# Patient Record
Sex: Male | Born: 1995 | Race: Black or African American | Hispanic: No | Marital: Married | Smoking: Current every day smoker
Health system: Southern US, Community
[De-identification: ages and names within clinical notes are randomized; demographics above are authoritative.]

---
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
## 2014-11-20 ENCOUNTER — Emergency Department (HOSPITAL_COMMUNITY)
Admission: EM | Admit: 2014-11-20 | Discharge: 2014-11-20 | Disposition: A | Discharge status: Home / Self Care | Payer: BLUE CROSS/BLUE SHIELD | Attending: Emergency Medicine | Admitting: Emergency Medicine

## 2014-11-20 ENCOUNTER — Emergency Department (HOSPITAL_COMMUNITY): Payer: BLUE CROSS/BLUE SHIELD

## 2014-11-20 ENCOUNTER — Encounter (HOSPITAL_COMMUNITY): Payer: Self-pay | Admitting: Emergency Medicine

## 2014-11-20 DIAGNOSIS — S60512A Abrasion of left hand, initial encounter: Secondary | ICD-10-CM | POA: Insufficient documentation

## 2014-11-20 DIAGNOSIS — S60511A Abrasion of right hand, initial encounter: Secondary | ICD-10-CM | POA: Diagnosis not present

## 2014-11-20 DIAGNOSIS — Y998 Other external cause status: Secondary | ICD-10-CM | POA: Diagnosis not present

## 2014-11-20 DIAGNOSIS — S67194A Crushing injury of right ring finger, initial encounter: Secondary | ICD-10-CM | POA: Diagnosis not present

## 2014-11-20 DIAGNOSIS — X58XXXA Exposure to other specified factors, initial encounter: Secondary | ICD-10-CM | POA: Insufficient documentation

## 2014-11-20 DIAGNOSIS — Y92310 Basketball court as the place of occurrence of the external cause: Secondary | ICD-10-CM | POA: Insufficient documentation

## 2014-11-20 DIAGNOSIS — Z791 Long term (current) use of non-steroidal anti-inflammatories (NSAID): Secondary | ICD-10-CM | POA: Insufficient documentation

## 2014-11-20 DIAGNOSIS — S60519A Abrasion of unspecified hand, initial encounter: Secondary | ICD-10-CM

## 2014-11-20 DIAGNOSIS — Y9367 Activity, basketball: Secondary | ICD-10-CM | POA: Diagnosis not present

## 2014-11-20 DIAGNOSIS — S6991XA Unspecified injury of right wrist, hand and finger(s), initial encounter: Secondary | ICD-10-CM

## 2014-11-20 MED ORDER — IBUPROFEN 400 MG PO TABS
600.0000 mg | ORAL_TABLET | Freq: Once | ORAL | Status: AC
  Administered 2014-11-20: 600 mg via ORAL
  Filled 2014-11-20: qty 2

## 2014-11-20 MED ORDER — IBUPROFEN 600 MG PO TABS: 600.0000 mg | ORAL_TABLET | Freq: Four times a day (QID) | ORAL | Status: DC | PRN

## 2014-11-20 NOTE — ED Notes (Signed)
Pt states he got abrasions to hand yesterday playing ball and he it his rt hand on dresser tonight. The ring finger is swollen.

## 2014-11-20 NOTE — ED Provider Notes (Signed)
CSN: [id number]     Arrival date & time 11/20/14  0143 History   First MD Initiated Contact with Patient 11/20/14 0156     Chief Complaint  Patient presents with  . Hand Pain     (Consider location/radiation/quality/duration/timing/severity/associated sxs/prior Treatment) HPI  This a 19 year old male who presents with hand injury. Patient reports that he was playing basketball yesterday when he scraped the backside of his bilateral hands. Earlier this evening he states that he hit his right hand on the dresser and has pain to his right fourth digits. He is right-handed.  Currently he rates his pain 8 out 10. He has not taken anything for pain. Tetanus approximate 4 years ago. Denies other injury.   History reviewed. No pertinent past medical history. History reviewed. No pertinent past surgical history. History reviewed. No pertinent family history. Social History  Substance Use Topics  . Smoking status: Never Smoker   . Smokeless tobacco: Never Used  . Alcohol Use: No    Review of Systems  Musculoskeletal:       Finger pain  Skin: Positive for wound.  All other systems reviewed and are negative.     Allergies  Review of patient's allergies indicates no known allergies.  Home Medications   Prior to Admission medications   Medication Sig Start Date End Date Taking? Authorizing Provider  diclofenac (VOLTAREN) 75 MG EC tablet Take 1 tablet (75 mg total) by mouth 2 (two) times daily. 01/09/13   Merlyn Albert, MD  etodolac (LODINE) 400 MG tablet Take 1 tablet (400 mg total) by mouth 2 (two) times daily. 01/07/13   Merlyn Albert, MD  ibuprofen (ADVIL,MOTRIN) 600 MG tablet Take 1 tablet (600 mg total) by mouth every 6 (six) hours as needed for moderate pain. 11/20/14   Shon Baton, MD   BP 123/68 mmHg  Pulse 69  Temp(Src) 98.7 F (37.1 C)  Resp 18  Ht 5\' 9"  (1.753 m)  Wt 152 lb (68.947 kg)  BMI 22.44 kg/m2  SpO2 98% Physical Exam  Constitutional: He is oriented  to person, place, and time. He appears well-developed and well-nourished.  HENT:  Head: Normocephalic and atraumatic.  Cardiovascular: Normal rate and regular rhythm.   Pulmonary/Chest: Effort normal. No respiratory distress.  Musculoskeletal:  Focused examination of the bilateral hands reveals mild swelling over the right fourth PIP joint, range of motion limited secondary to pain but patient is able to flex and extend. No snuffbox tenderness of the bilateral hands, no other obvious deformities  Neurological: He is alert and oriented to person, place, and time.  Skin: Skin is warm and dry.  Multiple abrasions over the dorsum of the bilateral hands mostly over the MCP and PIP joints, superficial, bleeding controlled  Psychiatric: He has a normal mood and affect.  Nursing note and vitals reviewed.   ED Course  Procedures (including critical care time) Labs Review Labs Reviewed - No data to display  Imaging Review Dg Hand Complete Right  11/20/2014   CLINICAL DATA:  Right hand pain after injury playing basketball yesterday. Re-injured hand tonight, hitting against a dresser drawer.  EXAM: RIGHT HAND - COMPLETE 3+ VIEW  COMPARISON:  None.  FINDINGS: No fracture or dislocation. The alignment and joint spaces are maintained. Mild dorsal soft tissue edema, no radiopaque foreign body.  IMPRESSION: Mild soft tissue edema.  No fracture or dislocation.   Electronically Signed   By: Rubye Oaks M.D.   On: 11/20/2014 02:33   I  have personally reviewed and evaluated these images and lab results as part of my medical decision-making.   EKG Interpretation None      MDM   Final diagnoses:  Hand abrasion, unspecified laterality, initial encounter  Jammed finger (interphalangeal joint), right, initial encounter    Patient presents with abrasion sustained yesterday while playing basketball and additional injury earlier this evening after jamming his right finger on a dresser. Nontoxic. Range  of motion intact. No obvious deformities. Point films are negative. Patient given ibuprofen. Tetanus is up-to-date. Patient instructed on wound care for the abrasions.   After history, exam, and medical workup I feel the patient has been appropriately medically screened and is safe for discharge home. Pertinent diagnoses were discussed with the patient. Patient was given return precautions.     Shon Baton, MD 11/20/14 [phone]

## 2014-11-20 NOTE — Discharge Instructions (Signed)
Abrasion An abrasion is a cut or scrape of the skin. Abrasions do not extend through all layers of the skin and most heal within 10 days. It is important to care for your abrasion properly to prevent infection. CAUSES  Most abrasions are caused by falling on, or gliding across, the ground or other surface. When your skin rubs on something, the outer and inner layer of skin rubs off, causing an abrasion. DIAGNOSIS  Your caregiver will be able to diagnose an abrasion during a physical exam.  TREATMENT  Your treatment depends on how large and deep the abrasion is. Generally, your abrasion will be cleaned with water and a mild soap to remove any dirt or debris. An antibiotic ointment may be put over the abrasion to prevent an infection. A bandage (dressing) may be wrapped around the abrasion to keep it from getting dirty.  You may need a tetanus shot if:  You cannot remember when you had your last tetanus shot.  You have never had a tetanus shot.  The injury broke your skin. If you get a tetanus shot, your arm may swell, get red, and feel warm to the touch. This is common and not a problem. If you need a tetanus shot and you choose not to have one, there is a rare chance of getting tetanus. Sickness from tetanus can be serious.  HOME CARE INSTRUCTIONS   If a dressing was applied, change it at least once a day or as directed by your caregiver. If the bandage sticks, soak it off with warm water.   Wash the area with water and a mild soap to remove all the ointment 2 times a day. Rinse off the soap and pat the area dry with a clean towel.   Reapply any ointment as directed by your caregiver. This will help prevent infection and keep the bandage from sticking. Use gauze over the wound and under the dressing to help keep the bandage from sticking.   Change your dressing right away if it becomes wet or dirty.   Only take over-the-counter or prescription medicines for pain, discomfort, or fever as  directed by your caregiver.   Follow up with your caregiver within 24-48 hours for a wound check, or as directed. If you were not given a wound-check appointment, look closely at your abrasion for redness, swelling, or pus. These are signs of infection. SEEK IMMEDIATE MEDICAL CARE IF:   You have increasing pain in the wound.   You have redness, swelling, or tenderness around the wound.   You have pus coming from the wound.   You have a fever or persistent symptoms for more than 2-3 days.  You have a fever and your symptoms suddenly get worse.  You have a bad smell coming from the wound or dressing.  MAKE SURE YOU:   Understand these instructions.  Will watch your condition.  Will get help right away if you are not doing well or get worse. Document Released: 12/01/2004 Document Revised: 02/08/2012 Document Reviewed: 01/25/2011 [facility] Patient Information 2015 [place], [place]. This information is not intended to replace advice given to you by your health care provider. Make sure you discuss any questions you have with your health care provider.  Jammed Finger A jammed finger is a term used to describe a variety of injuries. The injuries usually involve the joint in the middle of the finger (not the joint near the tip of the finger, and not the joint close to the hand). Usually,  a jammed finger involves injured tendons or ligaments (sprain). CAUSES  "Jamming" a finger usually refers to "stubbing" the finger on an object, such as a ball during an athletic activity. Usually, the joint is extended at the time of injury, and the blow forces the joint further into extension than it normally goes. SYMPTOMS  Pain. Swelling. Discoloration and bruising around the joint. Difficulty bending, straightening, and using the finger normally. DIAGNOSIS  An X-ray may be done to make sure there is no broken bone (fracture). TREATMENT  Put ice on the injured area. Put ice in a plastic  bag. Place a towel between your skin and the bag. Leave the ice on for 15-20 minutes at a time, 03-04 times a day. Raise (elevate) the affected finger above the level of your heart to decrease swelling. Take medicine as directed by your caregiver. Depending on the type of injury, your caregiver may also recommend that you: "Buddy tape" the injured finger to the finger or fingers beside it. Wear a protective splint. Do strengthening exercises after the finger has begun to heal. Do physical therapy to regain strength and mobility in the finger. Follow up with a hand specialist. HOME CARE INSTRUCTIONS  Avoid activities that may injure the finger again until it is totally healed. SEEK IMMEDIATE MEDICAL CARE IF:  You develop pain that is more severe. You develop increased swelling. There is an obvious deformity in the joint. You have severe bruising. You have red or blue discoloration. You or your child has an oral temperature above 102 F (38.9 C), not controlled by medicine. You have an abnormally cold finger. Feeling in your finger is absent or decreasing. MAKE SURE YOU:  Understand these instructions. Will watch your condition. Will get help right away if you are not doing well or get worse. Document Released: 08/11/2009 Document Revised: 05/16/2011 Document Reviewed: 08/11/2009 [facility] Patient Information 2015 [place], [place]. This information is not intended to replace advice given to you by your health care provider. Make sure you discuss any questions you have with your health care provider.

## 2015-03-03 IMAGING — CR DG ANKLE COMPLETE 3+V*L*
3 series · 3 of 3 positions shown · non-contrast
Comparison: None

CLINICAL DATA: Lateral left ankle pain, injury playing football 2
weeks ago, struck by helmet

EXAM:
LEFT ANKLE COMPLETE - 3+ VIEW

[view not recorded (1 of 3)]
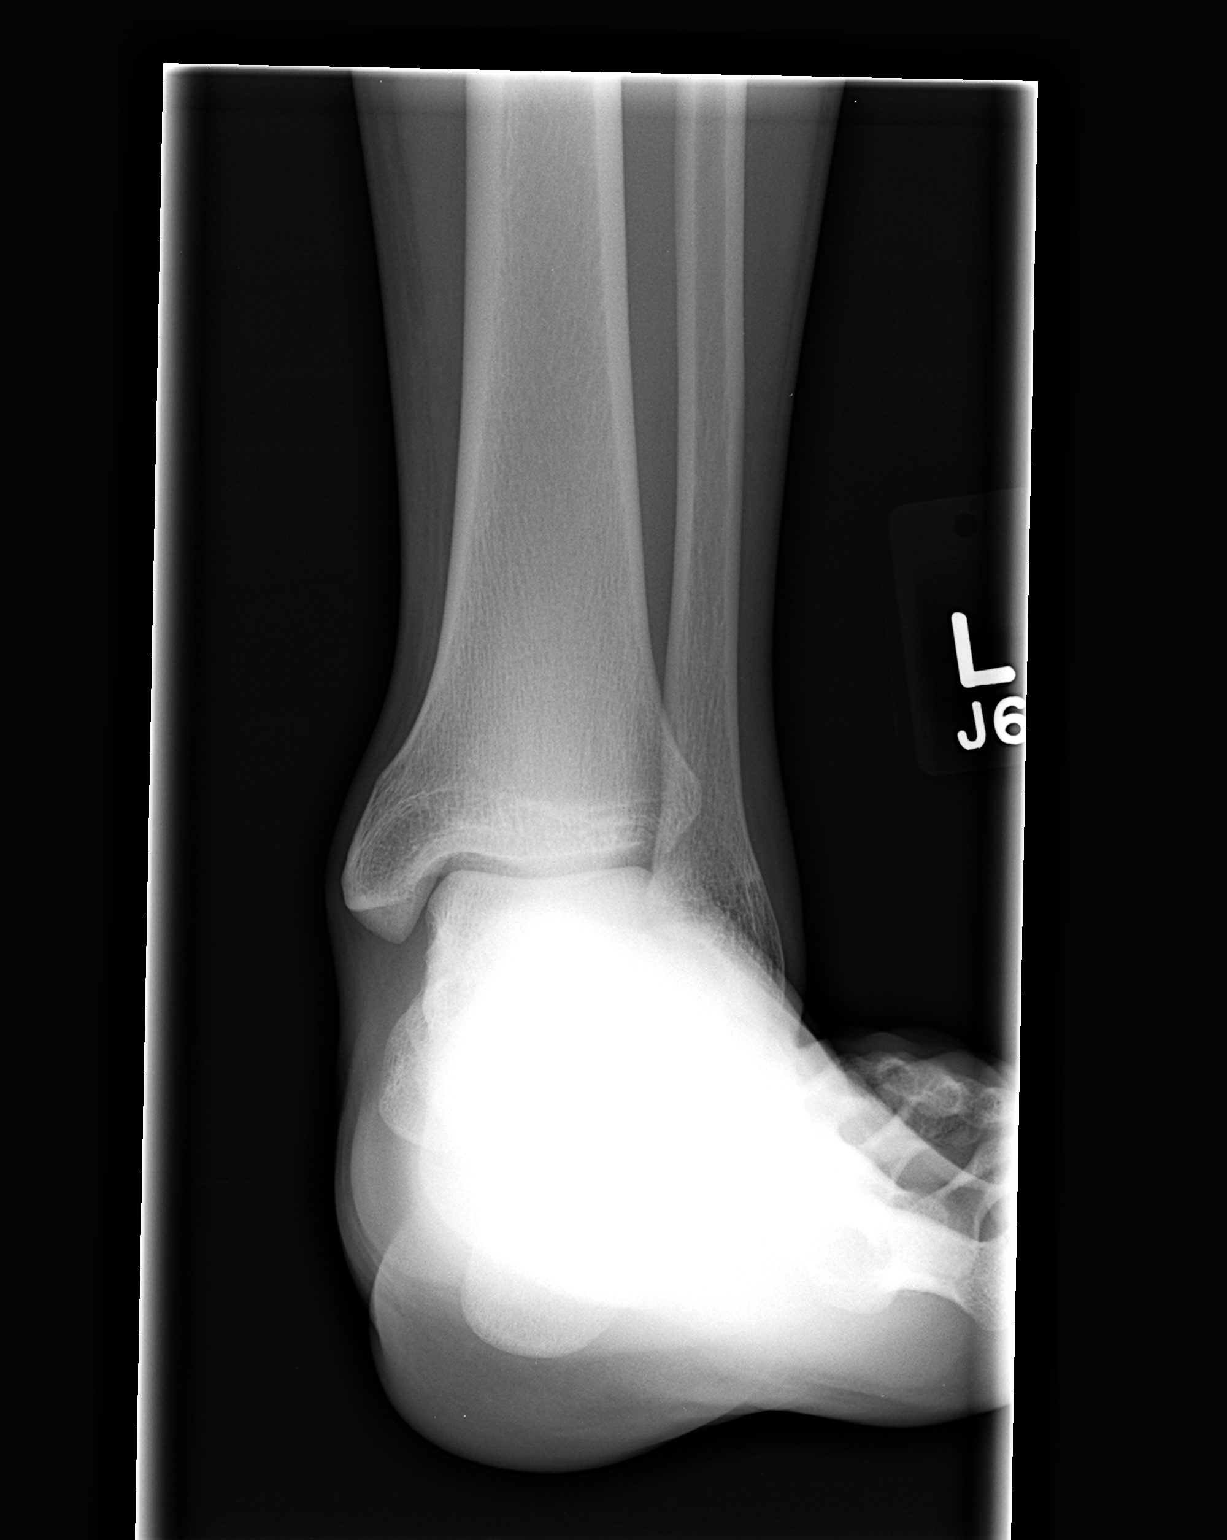

[view not recorded (2 of 3)]
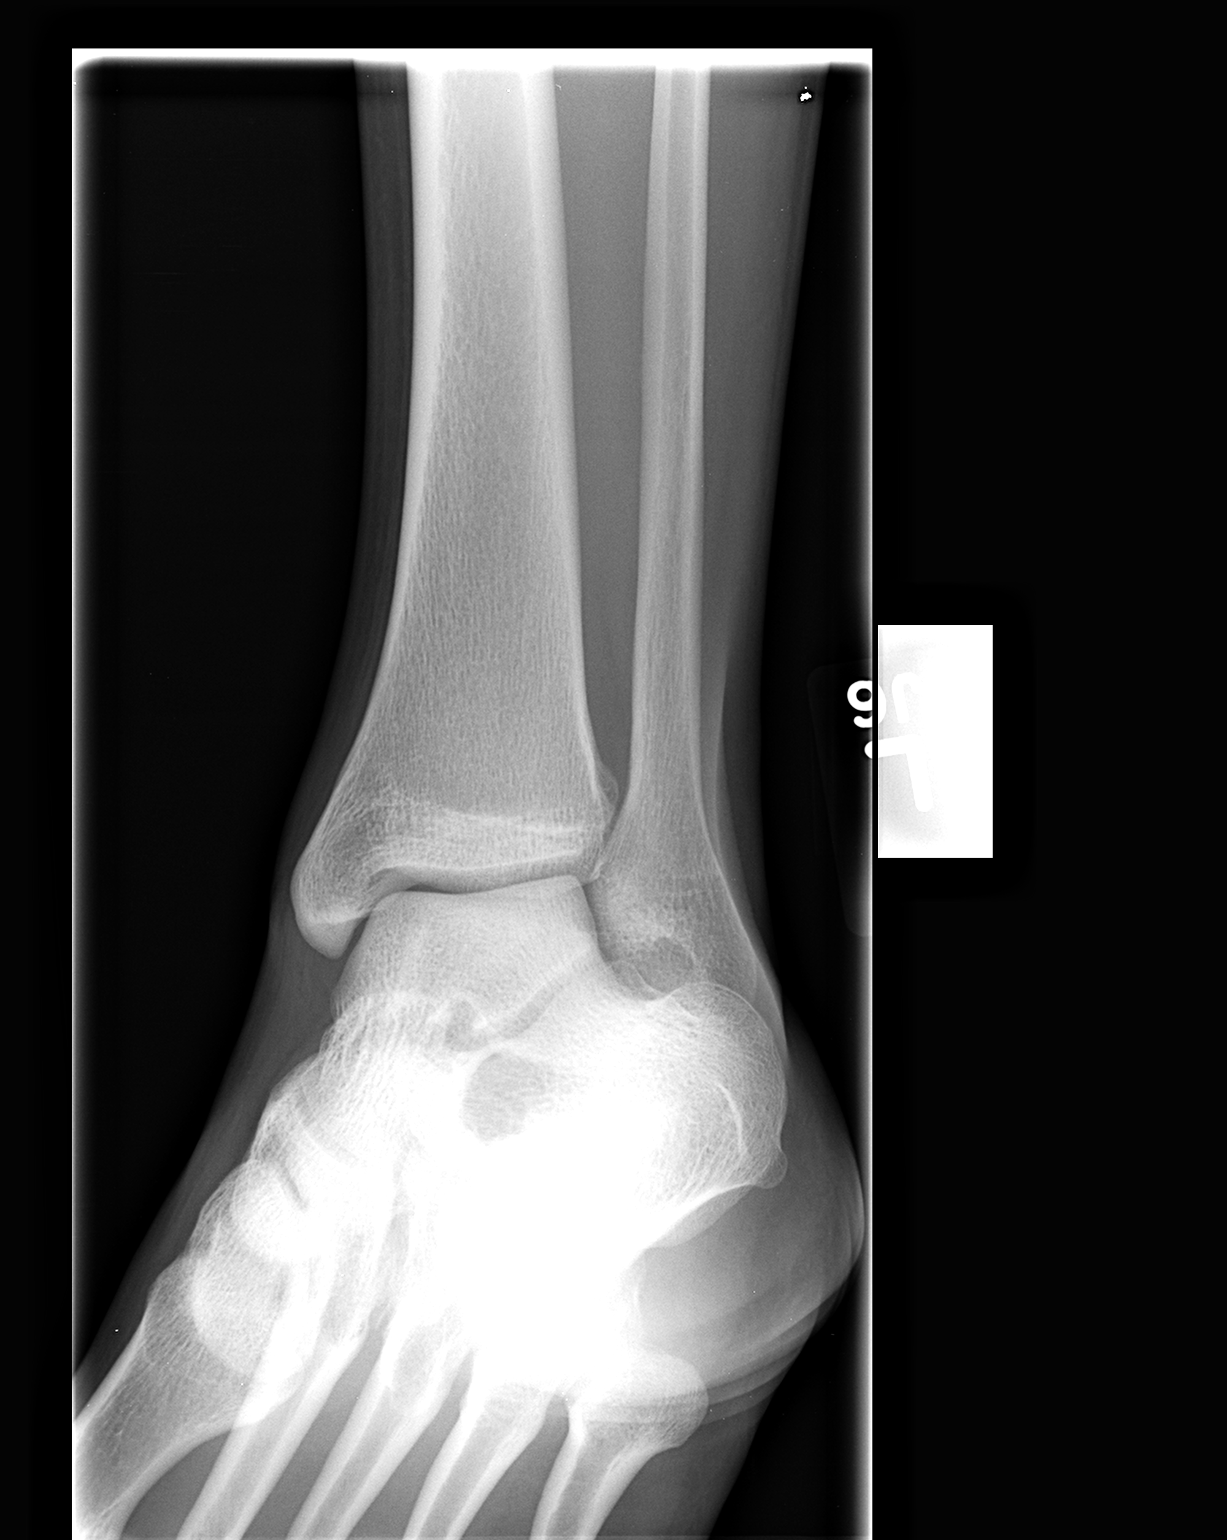

[view not recorded (3 of 3)]
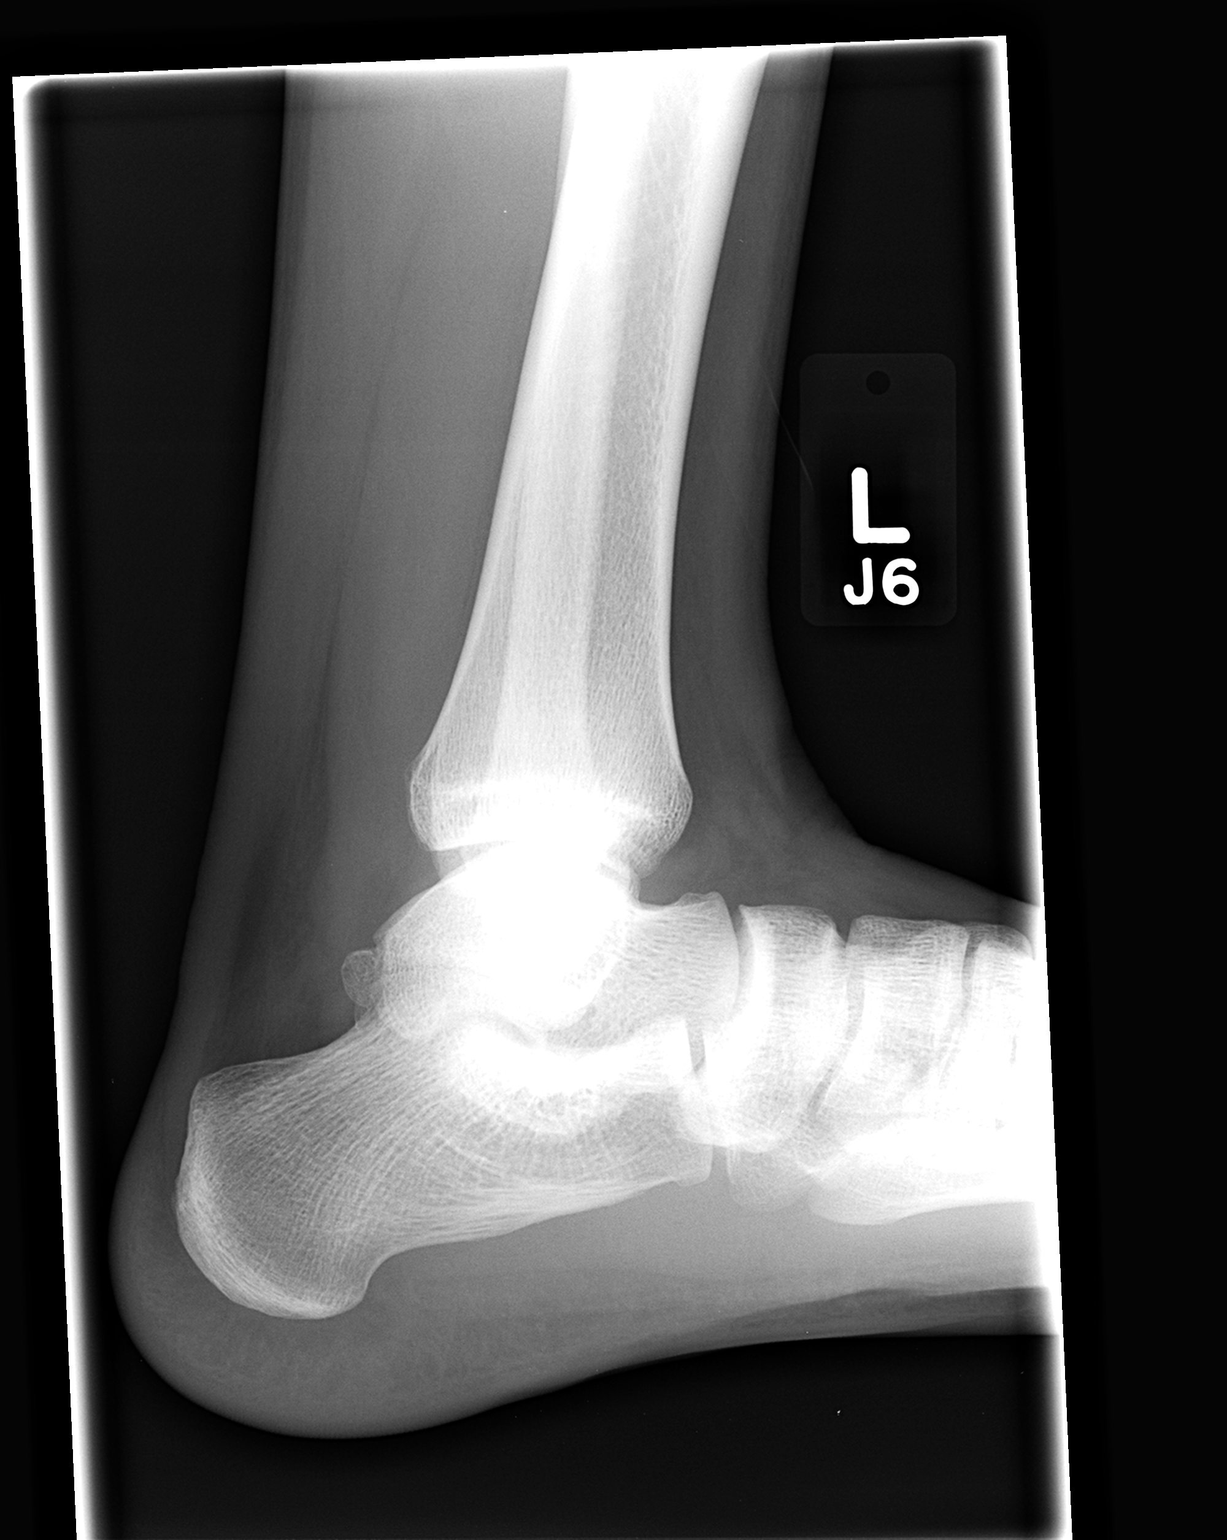

[3 of 3 positions shown; findings below may reference images not displayed]

FINDINGS: Osseous mineralization normal.

Joint spaces preserved.

No fracture, dislocation, or bone destruction.
IMPRESSION: Normal exam.

## 2015-06-26 ENCOUNTER — Telehealth: Payer: Self-pay | Admitting: Family Medicine

## 2015-06-26 NOTE — Telephone Encounter (Signed)
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

## 2015-06-26 NOTE — Telephone Encounter (Signed)
ok 

## 2015-08-29 ENCOUNTER — Emergency Department (HOSPITAL_COMMUNITY)
Admission: EM | Admit: 2015-08-29 | Discharge: 2015-08-29 | Disposition: A | Discharge status: Home / Self Care | Payer: BLUE CROSS/BLUE SHIELD | Attending: Emergency Medicine | Admitting: Emergency Medicine

## 2015-08-29 ENCOUNTER — Encounter (HOSPITAL_COMMUNITY): Payer: Self-pay | Admitting: *Deleted

## 2015-08-29 DIAGNOSIS — W319XXA Contact with unspecified machinery, initial encounter: Secondary | ICD-10-CM | POA: Diagnosis not present

## 2015-08-29 DIAGNOSIS — Y939 Activity, unspecified: Secondary | ICD-10-CM | POA: Insufficient documentation

## 2015-08-29 DIAGNOSIS — S0093XA Contusion of unspecified part of head, initial encounter: Secondary | ICD-10-CM | POA: Diagnosis not present

## 2015-08-29 DIAGNOSIS — Y990 Civilian activity done for income or pay: Secondary | ICD-10-CM | POA: Diagnosis not present

## 2015-08-29 DIAGNOSIS — S0990XA Unspecified injury of head, initial encounter: Secondary | ICD-10-CM | POA: Diagnosis present

## 2015-08-29 DIAGNOSIS — S0083XA Contusion of other part of head, initial encounter: Secondary | ICD-10-CM

## 2015-08-29 DIAGNOSIS — Y929 Unspecified place or not applicable: Secondary | ICD-10-CM | POA: Insufficient documentation

## 2015-08-29 MED ORDER — IBUPROFEN 400 MG PO TABS
400.0000 mg | ORAL_TABLET | Freq: Once | ORAL | Status: AC
  Administered 2015-08-29: 400 mg via ORAL
  Filled 2015-08-29: qty 1

## 2015-08-29 NOTE — Discharge Instructions (Signed)
Facial or Scalp Contusion ° A facial or scalp contusion is a deep bruise on the face or head. Contusions happen when an injury causes bleeding under the skin. Signs of bruising include pain, puffiness (swelling), and discolored skin. The contusion may turn blue, purple, or yellow. °HOME CARE °· Only take medicines as told by your doctor. °· Put ice on the injured area. °¨ Put ice in a plastic bag. °¨ Place a towel between your skin and the bag. °¨ Leave the ice on for 20 minutes, 2-3 times a day. °GET HELP IF: °· You have bite problems. °· You have pain when chewing. °· You are worried about your face not healing normally. °GET HELP RIGHT AWAY IF:  °· You have severe pain or a headache and medicine does not help. °· You are very tired or confused, or your personality changes. °· You throw up (vomit). °· You have a nosebleed that will not stop. °· You see two of everything (double vision) or have blurry vision. °· You have fluid coming from your nose or ear. °· You have problems walking or using your arms or legs. °MAKE SURE YOU:  °· Understand these instructions. °· Will watch your condition. °· Will get help right away if you are not doing well or get worse. °  °This information is not intended to replace advice given to you by your health care provider. Make sure you discuss any questions you have with your health care provider. °  °Document Released: 02/10/2011 Document Revised: 03/14/2014 Document Reviewed: 10/04/2012 °Elsevier Interactive Patient Education ©2016 [employer] Inc[employer] ° °

## 2015-08-29 NOTE — ED Provider Notes (Signed)
CSN: [id number]     Arrival date & time 08/29/15  1605 History   First MD Initiated Contact with Patient 08/29/15 1630     Chief Complaint  Patient presents with  . Head Injury     (Consider location/radiation/quality/duration/timing/severity/associated sxs/prior Treatment) Patient is a 20 y.o. male presenting with head injury. The history is provided by the patient.  Head Injury Location:  R temporal Time since incident:  1 day Mechanism of injury comment:  Pt his side of head on a machine at work. Pain details:    Quality:  Aching   Radiates to:  Jaw   Severity:  Mild   Duration:  1 day   Timing:  Intermittent   Progression:  Unchanged Chronicity:  New Relieved by:  Nothing Exacerbated by: opening mouth. Associated symptoms: no blurred vision, no disorientation, no double vision, no focal weakness, no neck pain and no seizures     History reviewed. No pertinent past medical history. History reviewed. No pertinent past surgical history. No family history on file. Social History  Substance Use Topics  . Smoking status: Never Smoker   . Smokeless tobacco: Never Used  . Alcohol Use: No    Review of Systems  Constitutional: Negative for activity change.       All ROS Neg except as noted in HPI  HENT: Negative for nosebleeds.   Eyes: Negative for blurred vision, double vision, photophobia and discharge.  Respiratory: Negative for cough, shortness of breath and wheezing.   Cardiovascular: Negative for chest pain and palpitations.  Gastrointestinal: Negative for abdominal pain and blood in stool.  Genitourinary: Negative for dysuria, frequency and hematuria.  Musculoskeletal: Negative for back pain, arthralgias and neck pain.  Skin: Negative.   Neurological: Negative for dizziness, focal weakness, seizures and speech difficulty.  Psychiatric/Behavioral: Negative for hallucinations and confusion.      Allergies  Review of patient's allergies indicates no known  allergies.  Home Medications   Prior to Admission medications   Medication Sig Start Date End Date Taking? Authorizing Provider  diclofenac (VOLTAREN) 75 MG EC tablet Take 1 tablet (75 mg total) by mouth 2 (two) times daily. 01/09/13   Merlyn AlbertWilliam S Luking, MD  etodolac (LODINE) 400 MG tablet Take 1 tablet (400 mg total) by mouth 2 (two) times daily. 01/07/13   Merlyn AlbertWilliam S Luking, MD  ibuprofen (ADVIL,MOTRIN) 600 MG tablet Take 1 tablet (600 mg total) by mouth every 6 (six) hours as needed for moderate pain. 11/20/14   Shon Batonourtney F Horton, MD   BP 120/79 mmHg  Pulse 61  Temp(Src) 98.1 F (36.7 C)  Resp 16  Ht 5\' 9"  (1.753 m)  Wt 68.04 kg  BMI 22.14 kg/m2  SpO2 98% Physical Exam  Constitutional: He is oriented to person, place, and time. He appears well-developed and well-nourished.  Non-toxic appearance.  HENT:  Head: Normocephalic. Head is with contusion. Head is without raccoon's eyes.    Right Ear: Tympanic membrane and external ear normal.  Left Ear: Tympanic membrane and external ear normal.  Eyes: EOM and lids are normal. Pupils are equal, round, and reactive to light.  Neck: Normal range of motion. Neck supple. Carotid bruit is not present.  Cardiovascular: Normal rate, regular rhythm, normal heart sounds, intact distal pulses and normal pulses.   Pulmonary/Chest: Breath sounds normal. No respiratory distress.  Abdominal: Soft. Bowel sounds are normal. There is no tenderness. There is no guarding.  Musculoskeletal: Normal range of motion.  Lymphadenopathy:  Head (right side): No submandibular adenopathy present.       Head (left side): No submandibular adenopathy present.    He has no cervical adenopathy.  Neurological: He is alert and oriented to person, place, and time. He has normal strength. No cranial nerve deficit or sensory deficit.  Skin: Skin is warm and dry.  Psychiatric: He has a normal mood and affect. His speech is normal.  Nursing note and vitals  reviewed.   ED Course  Procedures (including critical care time) Labs Review Labs Reviewed - No data to display  Imaging Review No results found. I have personally reviewed and evaluated these images and lab results as part of my medical decision-making.   EKG Interpretation None      MDM  Vital signs stable. No acute neuro  changes or problems noted. Exam favors temporal contusion. Pt will use ice pack, tylenol and/or ibuprofen for soreness. No hx of LOC. Pt to reutn if any changes or problem.   Final diagnoses:  None    **I have reviewed nursing notes, vital signs, and all appropriate lab and imaging results for this patient.    Ivery QualeHobson Latissa Frick, PA-C 08/29/15 1713  Eber HongBrian Miller, MD 08/30/15 1540

## 2015-08-29 NOTE — ED Notes (Signed)
Pt states he was turning and hit his head on a machine at work yesterday. Pt denies any loss of consciousness. Pt denies any n/v or dizziness. Pt states his head is tender to the touch.

## 2015-09-04 ENCOUNTER — Ambulatory Visit (INDEPENDENT_AMBULATORY_CARE_PROVIDER_SITE_OTHER): Payer: BLUE CROSS/BLUE SHIELD | Admitting: Nurse Practitioner

## 2015-09-04 ENCOUNTER — Encounter: Payer: Self-pay | Admitting: Nurse Practitioner

## 2015-09-04 VITALS — BP 122/76 | Ht 69.5 in | Wt 140.0 lb

## 2015-09-04 DIAGNOSIS — L309 Dermatitis, unspecified: Secondary | ICD-10-CM | POA: Diagnosis not present

## 2015-09-04 NOTE — Progress Notes (Signed)
Subjective:  Presents to request a letter for the [employer] recru[employer] regarding his past history of eczema. Has not had a flareup in years. Has not been on medication for this in years. Does not have any problems today.  Objective:   BP 122/76 mmHg  Ht 5' 9.5" (1.765 m)  Wt 140 lb (63.504 kg)  BMI 20.39 kg/m2 NAD. Alert, oriented. Lungs clear. Heart regular rate rhythm. Skin is clear. No evidence of eczema.  Assessment: Eczema Resolved  Plan: A review of his past record shows no treatment or diagnosis of eczema since 2011. Patient given a letter stating this. Call back if further information is needed.

## 2015-09-24 ENCOUNTER — Other Ambulatory Visit: Payer: Self-pay | Admitting: *Deleted

## 2016-05-27 ENCOUNTER — Encounter: Payer: Self-pay | Admitting: Family Medicine

## 2016-05-27 ENCOUNTER — Emergency Department (HOSPITAL_COMMUNITY)
Admission: EM | Admit: 2016-05-27 | Discharge: 2016-05-27 | Disposition: A | Discharge status: Home / Self Care | Payer: No Typology Code available for payment source | Attending: Dermatology | Admitting: Dermatology

## 2016-05-27 ENCOUNTER — Ambulatory Visit (INDEPENDENT_AMBULATORY_CARE_PROVIDER_SITE_OTHER): Payer: PRIVATE HEALTH INSURANCE | Admitting: Family Medicine

## 2016-05-27 ENCOUNTER — Encounter (HOSPITAL_COMMUNITY): Payer: Self-pay

## 2016-05-27 VITALS — BP 112/72 | Ht 70.0 in | Wt 150.0 lb

## 2016-05-27 DIAGNOSIS — Z0000 Encounter for general adult medical examination without abnormal findings: Secondary | ICD-10-CM | POA: Diagnosis not present

## 2016-05-27 DIAGNOSIS — F17200 Nicotine dependence, unspecified, uncomplicated: Secondary | ICD-10-CM | POA: Insufficient documentation

## 2016-05-27 DIAGNOSIS — Z5321 Procedure and treatment not carried out due to patient leaving prior to being seen by health care provider: Secondary | ICD-10-CM | POA: Diagnosis not present

## 2016-05-27 DIAGNOSIS — R1110 Vomiting, unspecified: Secondary | ICD-10-CM | POA: Insufficient documentation

## 2016-05-27 DIAGNOSIS — Z79899 Other long term (current) drug therapy: Secondary | ICD-10-CM | POA: Diagnosis not present

## 2016-05-27 NOTE — ED Notes (Signed)
Per registration clerk, the patient and family had to leave due to children needing care at home

## 2016-05-27 NOTE — Progress Notes (Signed)
   Subjective:    Patient ID: Danny Walls, male    DOB: 21-Dec-1995, 21 y.o.   MRN: [id number]  HPI The patient comes in today for a wellness visit.   Working at [employer]  Going good   A review of their health history was completed.  A review of medications was also completed.  Any needed refills; not taking any refills  Eating habits: not health conscious  Falls/  MVA accidents in past few months: none  Regular exercise: walks for 12 hours at work and a lot of [employer]ivist[employer]ting  Specialist pt sees on regular basis: none  Preventative health issues were discussed.   Additional concerns: none  Cigarettes one pk a week  Alcohol intake   Eats anything, stabding all dayMarried fellow two kids plus a third   Review of Systems  Constitutional: Negative for activity change, appetite change and fever.  HENT: Negative for congestion and rhinorrhea.   Eyes: Negative for discharge.  Respiratory: Negative for cough and wheezing.   Cardiovascular: Negative for chest pain.  Gastrointestinal: Negative for abdominal pain, blood in stool and vomiting.  Genitourinary: Negative for difficulty urinating and frequency.  Musculoskeletal: Negative for neck pain.  Skin: Negative for rash.  Allergic/Immunologic: Negative for environmental allergies and food allergies.  Neurological: Negative for weakness and headaches.  Psychiatric/Behavioral: Negative for agitation.  All other systems reviewed and are negative.      Objective:   Physical Exam  Constitutional: He appears well-developed and well-nourished.  HENT:  Head: Normocephalic and atraumatic.  Right Ear: External ear normal.  Left Ear: External ear normal.  Nose: Nose normal.  Mouth/Throat: Oropharynx is clear and moist.  Eyes: EOM are normal. Pupils are equal, round, and reactive to light.  Neck: Normal range of motion. Neck supple. No thyromegaly present.  Cardiovascular: Normal rate, regular rhythm and normal heart  sounds.   No murmur heard. Pulmonary/Chest: Effort normal and breath sounds normal. No respiratory distress. He has no wheezes.  Abdominal: Soft. Bowel sounds are normal. He exhibits no distension and no mass. There is no tenderness.  Genitourinary: Penis normal.  Musculoskeletal: Normal range of motion. He exhibits no edema.  Lymphadenopathy:    He has no cervical adenopathy.  Neurological: He is alert. He exhibits normal muscle tone.  Skin: Skin is warm and dry. No erythema.  Psychiatric: He has a normal mood and affect. His behavior is normal. Judgment normal.  Vitals reviewed.         Assessment & Plan:  Impression well adult exam overall doing well. Unfortunately smoking 1 pack per week. Strongly challenged stop smoking. Claims diet is decent and exercise level significant through work plan anticipatory guidance given. Appropriate blood work to fill out physical form. WSL

## 2016-05-27 NOTE — ED Triage Notes (Signed)
Per pt's wife, pt had eaten with some friends this evening and drank a couple of large drinks.  When he got home he started vomiting and wife is concerned that he has alcohol poisoning.   Pt is awake and alert, denies pain

## 2016-05-31 ENCOUNTER — Telehealth: Payer: Self-pay | Admitting: *Deleted

## 2016-05-31 NOTE — Telephone Encounter (Signed)
Left message to return call. We have form in nurse box to fill in for pt after he get bw done. Form needs to be faxed by 06/04/16. Called to remind pt to get bw done so we can fill out and fax by deadline.

## 2016-06-01 NOTE — Telephone Encounter (Signed)
Pt notified. He states he will go do bw tomorrow am. Hold message til we get results. Form needs to be faxed Friday.

## 2016-06-03 LAB — LIPID PANEL
CHOL/HDL RATIO: 2.6 ratio (ref 0.0–5.0)
CHOLESTEROL TOTAL: 155 mg/dL (ref 100–199)
HDL: 59 mg/dL (ref >39)
LDL CALC: 88 mg/dL (ref 0–99)
Triglycerides: 39 mg/dL (ref 0–149)
VLDL Cholesterol Cal: 8 mg/dL (ref 5–40)

## 2016-06-03 LAB — GLUCOSE, RANDOM: Glucose: 100 mg/dL — ABNORMAL HIGH (ref 65–99)

## 2016-06-03 NOTE — Telephone Encounter (Signed)
bw results filled in and form faxed. Copy scanned into chart. Original mailed to pt.

## 2016-06-05 ENCOUNTER — Encounter: Payer: Self-pay | Admitting: Family Medicine

## 2016-09-16 ENCOUNTER — Encounter (HOSPITAL_COMMUNITY): Payer: Self-pay | Admitting: Emergency Medicine

## 2016-09-16 ENCOUNTER — Emergency Department (HOSPITAL_COMMUNITY)
Admission: EM | Admit: 2016-09-16 | Discharge: 2016-09-16 | Disposition: A | Discharge status: Home / Self Care | Payer: No Typology Code available for payment source | Attending: Emergency Medicine | Admitting: Emergency Medicine

## 2016-09-16 DIAGNOSIS — J069 Acute upper respiratory infection, unspecified: Secondary | ICD-10-CM | POA: Insufficient documentation

## 2016-09-16 DIAGNOSIS — J019 Acute sinusitis, unspecified: Secondary | ICD-10-CM | POA: Insufficient documentation

## 2016-09-16 DIAGNOSIS — J329 Chronic sinusitis, unspecified: Secondary | ICD-10-CM

## 2016-09-16 DIAGNOSIS — Z79899 Other long term (current) drug therapy: Secondary | ICD-10-CM | POA: Insufficient documentation

## 2016-09-16 DIAGNOSIS — R51 Headache: Secondary | ICD-10-CM | POA: Insufficient documentation

## 2016-09-16 DIAGNOSIS — F17200 Nicotine dependence, unspecified, uncomplicated: Secondary | ICD-10-CM | POA: Insufficient documentation

## 2016-09-16 MED ORDER — IBUPROFEN 600 MG PO TABS: 600.0000 mg | ORAL_TABLET | Freq: Four times a day (QID) | ORAL | 0 refills | Status: DC

## 2016-09-16 MED ORDER — PSEUDOEPHEDRINE HCL 60 MG PO TABS
60.0000 mg | ORAL_TABLET | Freq: Once | ORAL | Status: AC
  Administered 2016-09-16: 60 mg via ORAL
  Filled 2016-09-16: qty 1

## 2016-09-16 MED ORDER — DEXAMETHASONE 4 MG PO TABS: 4.0000 mg | ORAL_TABLET | Freq: Two times a day (BID) | ORAL | 0 refills | Status: DC

## 2016-09-16 MED ORDER — IBUPROFEN 800 MG PO TABS
800.0000 mg | ORAL_TABLET | Freq: Once | ORAL | Status: AC
  Administered 2016-09-16: 800 mg via ORAL
  Filled 2016-09-16: qty 1

## 2016-09-16 MED ORDER — ACETAMINOPHEN 325 MG PO TABS
650.0000 mg | ORAL_TABLET | Freq: Once | ORAL | Status: AC
  Administered 2016-09-16: 650 mg via ORAL
  Filled 2016-09-16: qty 2

## 2016-09-16 MED ORDER — PROMETHAZINE HCL 12.5 MG PO TABS
12.5000 mg | ORAL_TABLET | Freq: Once | ORAL | Status: AC
  Administered 2016-09-16: 12.5 mg via ORAL
  Filled 2016-09-16: qty 1

## 2016-09-16 MED ORDER — LORATADINE-PSEUDOEPHEDRINE ER 5-120 MG PO TB12: 1.0000 | ORAL_TABLET | Freq: Two times a day (BID) | ORAL | 0 refills | Status: DC

## 2016-09-16 NOTE — ED Provider Notes (Signed)
[facility]-EMER[facility]Y DEPT Provider Note   CSN: [id number] Arrival date & time: 09/16/16  2013     History   Chief Complaint Chief Complaint  Patient presents with  . Cough  . Headache    HPI Danny Walls is a 21 y.o. male.  Patient is a 21 year old male who presents to the emergency department with a complaint of cough and headache.  The patient states that his symptoms started on yesterday. The patient stated that he initially started just not feeling well. This progressed to congestion, then cough, and then headache problems. He has not measured a temperature elevation at home. But states he's had body aches and some chills. He states he feels drained. There's been no unusual rash noted. He's not had any unusual neck stiffness or other problems. No nausea, vomiting, or diarrhea. Patient presents to the emergency department for additional evaluation and management.      History reviewed. No pertinent past medical history.  There are no active problems to display for this patient.   History reviewed. No pertinent surgical history.     Home Medications    Prior to Admission medications   Medication Sig Start Date End Date Taking? Authorizing Provider  Multiple Vitamin (MULTIVITAMIN WITH MINERALS) TABS tablet Take 1 tablet by mouth daily.    [provider]    Family History History reviewed. No pertinent family history.  Social History Social History  Substance Use Topics  . Smoking status: Current Every Day Smoker  . Smokeless tobacco: Never Used  . Alcohol use No     Allergies   Patient has no known allergies.   Review of Systems Review of Systems  Constitutional: Positive for activity change and chills.       All ROS Neg except as noted in HPI  HENT: Positive for congestion. Negative for nosebleeds.   Eyes: Negative for photophobia and discharge.  Respiratory: Positive for cough. Negative for shortness of breath and wheezing.   Cardiovascular:  Negative for chest pain and palpitations.  Gastrointestinal: Negative for abdominal pain and blood in stool.  Genitourinary: Negative for dysuria, frequency and hematuria.  Musculoskeletal: Positive for myalgias. Negative for arthralgias, back pain and neck pain.  Skin: Negative.   Neurological: Positive for headaches. Negative for dizziness, seizures and speech difficulty.  Psychiatric/Behavioral: Negative for confusion and hallucinations.     Physical Exam Updated Vital Signs BP 124/83 (BP Location: Right Arm)   Pulse 89   Temp 99.5 F (37.5 C) (Oral)   Resp 18   Ht 5\' 9"  (1.753 m)   Wt 63.5 kg (140 lb)   SpO2 100%   BMI 20.67 kg/m   Physical Exam  Constitutional: He appears well-developed and well-nourished. No distress.  HENT:  Head: Normocephalic and atraumatic.  Right Ear: External ear normal.  Left Ear: External ear normal.  Nasal congestion is present.  There is mild fluid behind the tympanic membrane on the left. There is no redness or swelling of the mastoid area.  Eyes: Conjunctivae are normal. Right eye exhibits no discharge. Left eye exhibits no discharge. No scleral icterus.  Neck: Neck supple. No tracheal deviation present.  Cardiovascular: Normal rate, regular rhythm and intact distal pulses.   Pulmonary/Chest: Effort normal and breath sounds normal. No stridor. No respiratory distress. He has no wheezes. He has no rales.  Abdominal: Soft. Bowel sounds are normal. He exhibits no distension. There is no tenderness. There is no rebound and no guarding.  Musculoskeletal: He exhibits no edema  or tenderness.  Lymphadenopathy:    He has no cervical adenopathy.  Neurological: He is alert. He has normal strength. No cranial nerve deficit (no facial droop, extraocular movements intact, no slurred speech) or sensory deficit. He exhibits normal muscle tone. He displays no seizure activity. Coordination normal.  Skin: Skin is warm and dry. No rash noted.  Psychiatric: He  has a normal mood and affect.  Nursing note and vitals reviewed.    ED Treatments / Results  Labs (all labs ordered are listed, but only abnormal results are displayed) Labs Reviewed - No data to display  EKG  EKG Interpretation None       Radiology No results found.  Procedures Procedures (including critical care time)  Medications Ordered in ED Medications - No data to display   Initial Impression / Assessment and Plan / ED Course  I have reviewed the triage vital signs and the nursing notes.  Pertinent labs & imaging results that were available during my care of the patient were reviewed by me and considered in my medical decision making (see chart for details).       Final Clinical Impressions(s) / ED Diagnoses MDM Temperature is 99.5, otherwise vital signs within normal limits. The pulse oximetry is 100% on room air. Within normal limits by my interpretation. The examination suggest sinusitis and upper respiratory infection. The patient will be treated with Claritin-D and Decadron 2 times daily. He will use ibuprofen 600 mg 4 times daily with meals and at bedtime. We discussed the importance of good hydration. We also discussed the importance of good handwashing as this is contagious. The patient acknowledges understanding of the discharge instructions.    Final diagnoses:  Sinusitis, unspecified chronicity, unspecified location  Viral upper respiratory tract infection    New Prescriptions New Prescriptions   DEXAMETHASONE (DECADRON) 4 MG TABLET    Take 1 tablet (4 mg total) by mouth 2 (two) times daily with a meal.   IBUPROFEN (ADVIL,MOTRIN) 600 MG TABLET    Take 1 tablet (600 mg total) by mouth 4 (four) times daily.   LORATADINE-PSEUDOEPHEDRINE (CLARITIN-D 12 HOUR) 5-120 MG TABLET    Take 1 tablet by mouth 2 (two) times daily.     Ivery QualeBryant, Farouk Vivero, PA-C 09/16/16 2249    Samuel JesterMcManus, Kathleen, DO 09/21/16 [phone]

## 2016-09-16 NOTE — Discharge Instructions (Signed)
Your temperature is 99.5, otherwise your vital signs within normal limits. Your oxygen level is 100%. Your examination favors a sinusitis and upper respiratory infection. Please use Claritin-D every 12 hours. Use 600 mg of ibuprofen with breakfast, lunch, dinner, and at bedtime. It is important that you hydrate. Please increase water, Gatorade, popsicles, juices, etc. Please wash hands frequently. Keep your distance from mother's as this can be contagious. Please see Dr.Luking in the office for follow-up if not improving.

## 2016-09-16 NOTE — ED Triage Notes (Signed)
Patient complaining of cough and headache since yesterday.

## 2017-01-13 IMAGING — DX DG HAND COMPLETE 3+V*R*
3 series · 3 of 3 positions shown · non-contrast
Comparison: None.

CLINICAL DATA: Right hand pain after injury playing basketball
yesterday. Re-injured hand tonight, hitting against a dresser
drawer.

EXAM:
RIGHT HAND - COMPLETE 3+ VIEW

[hand pa]
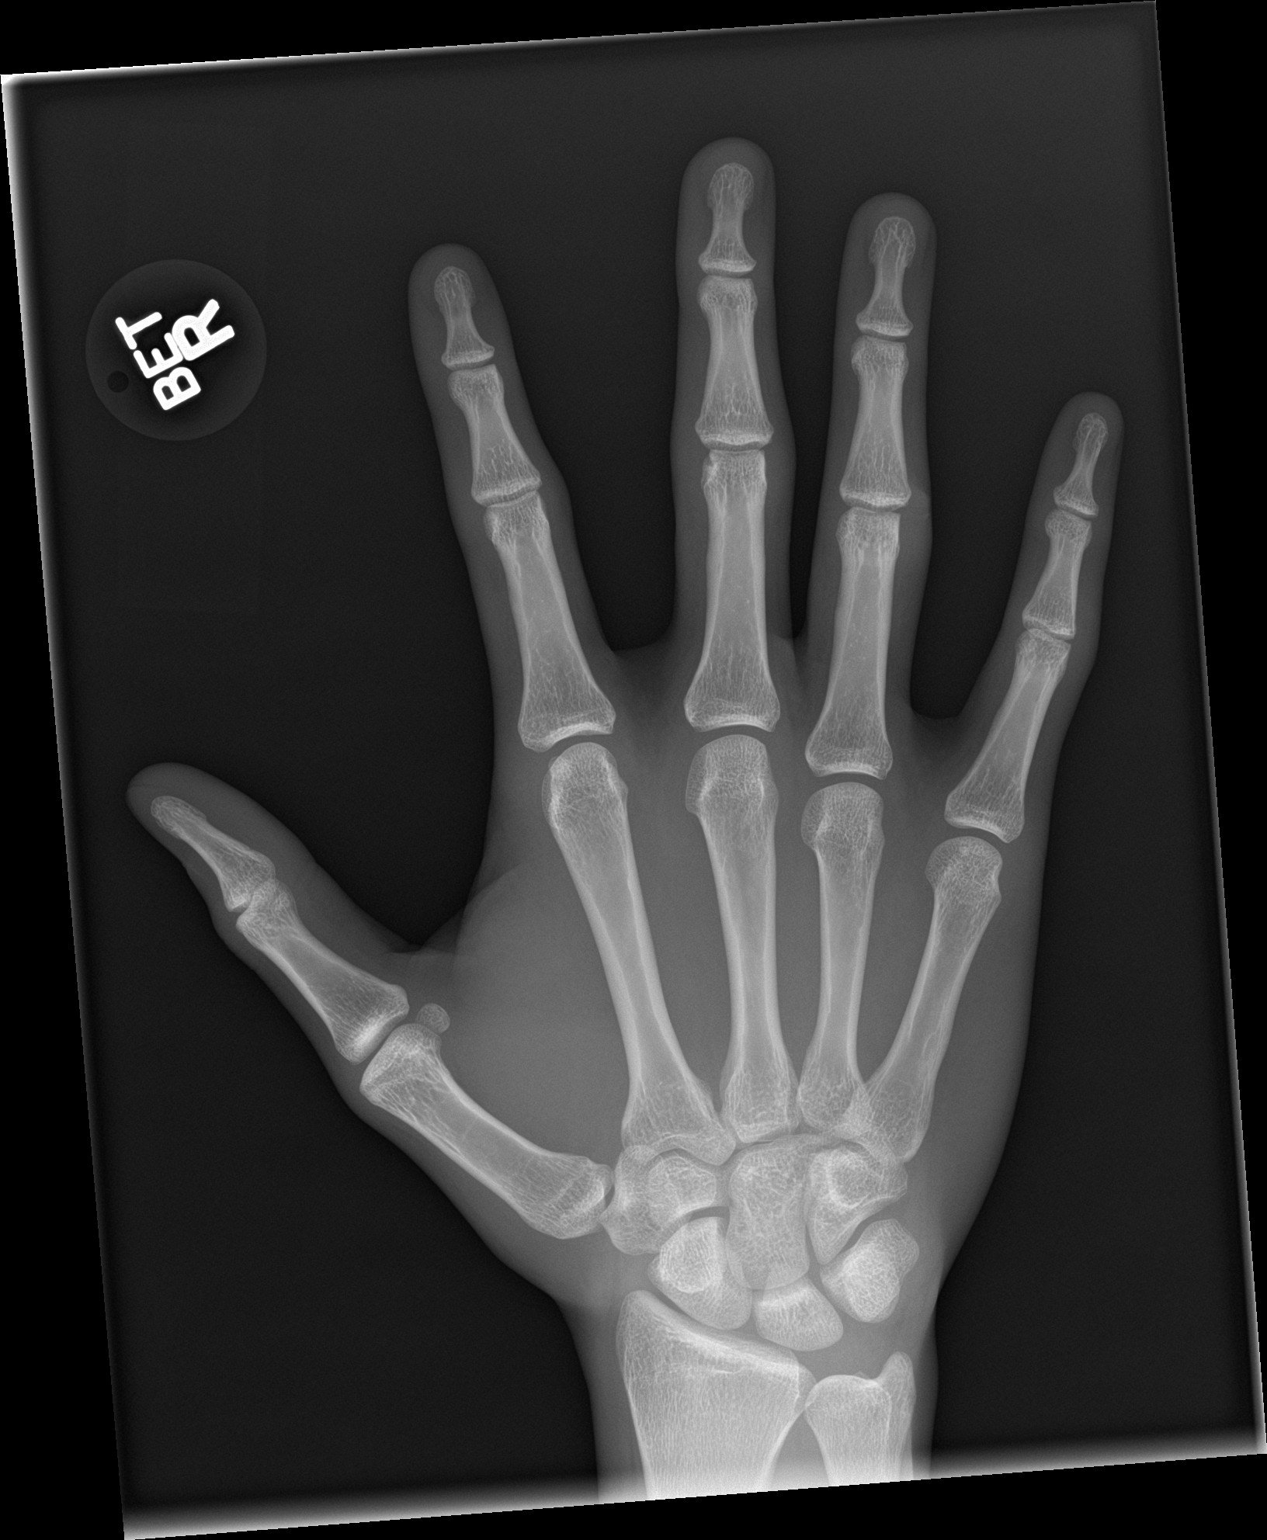

[hand obl]
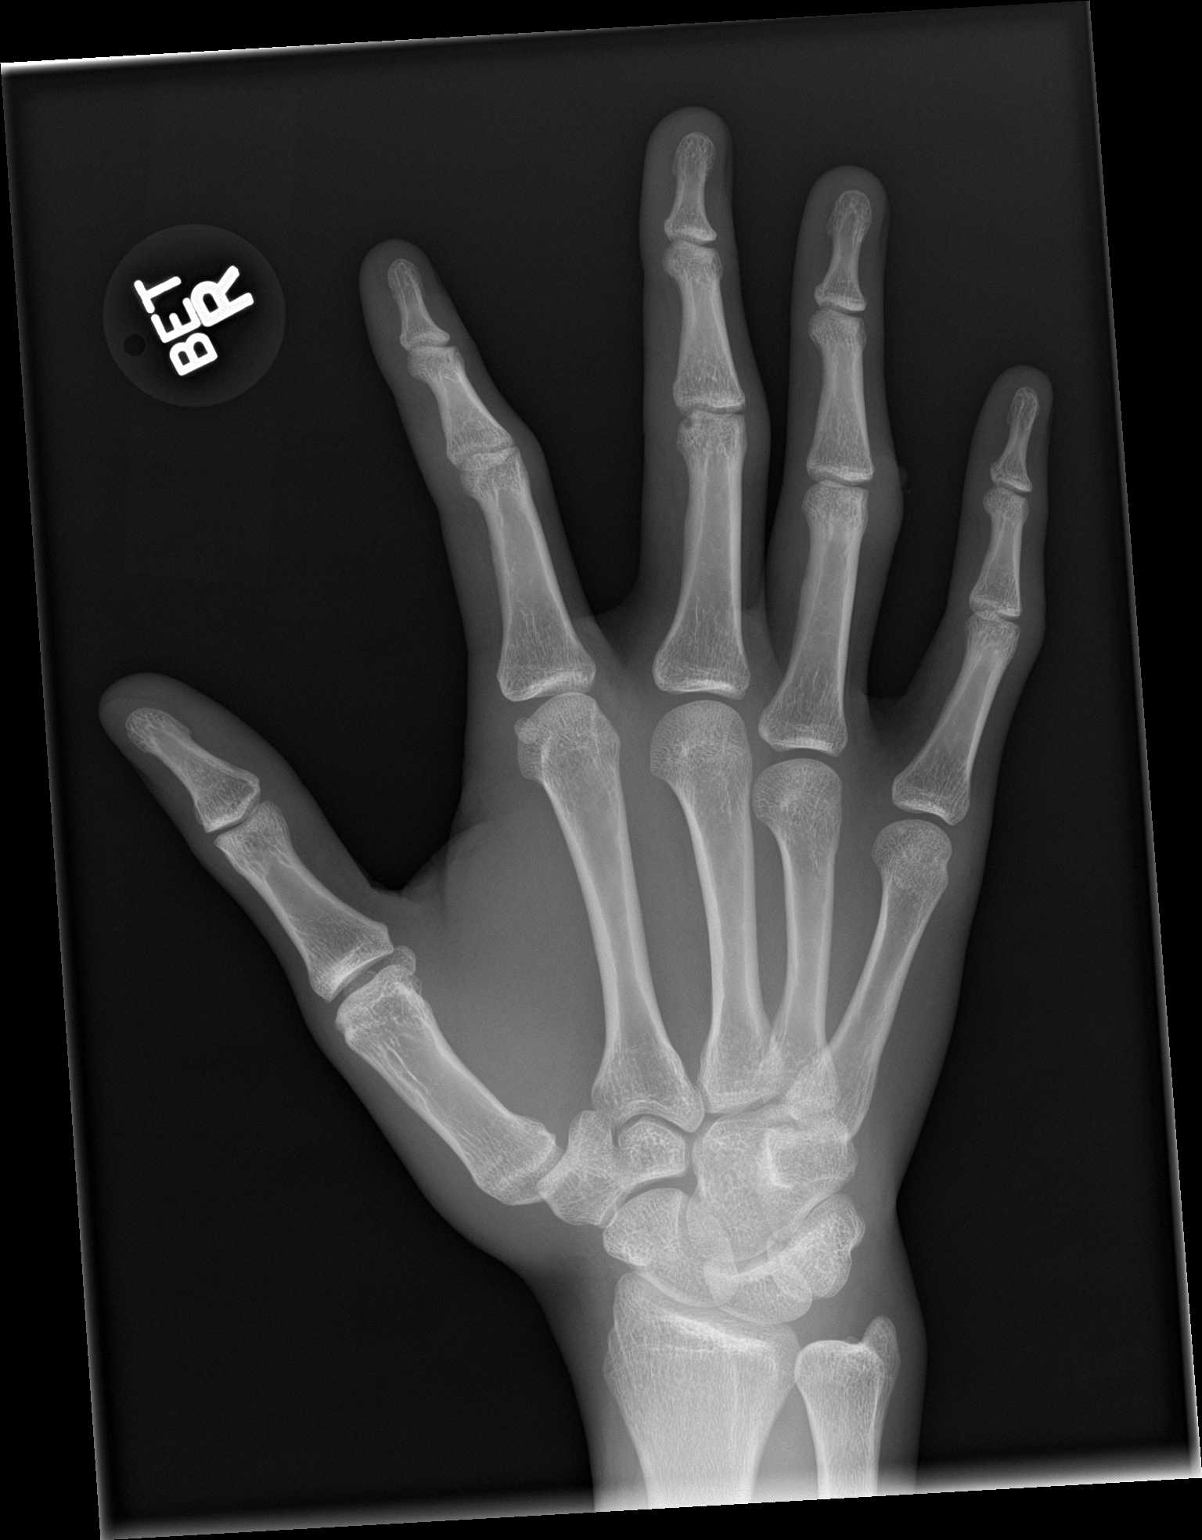

[hand lat]
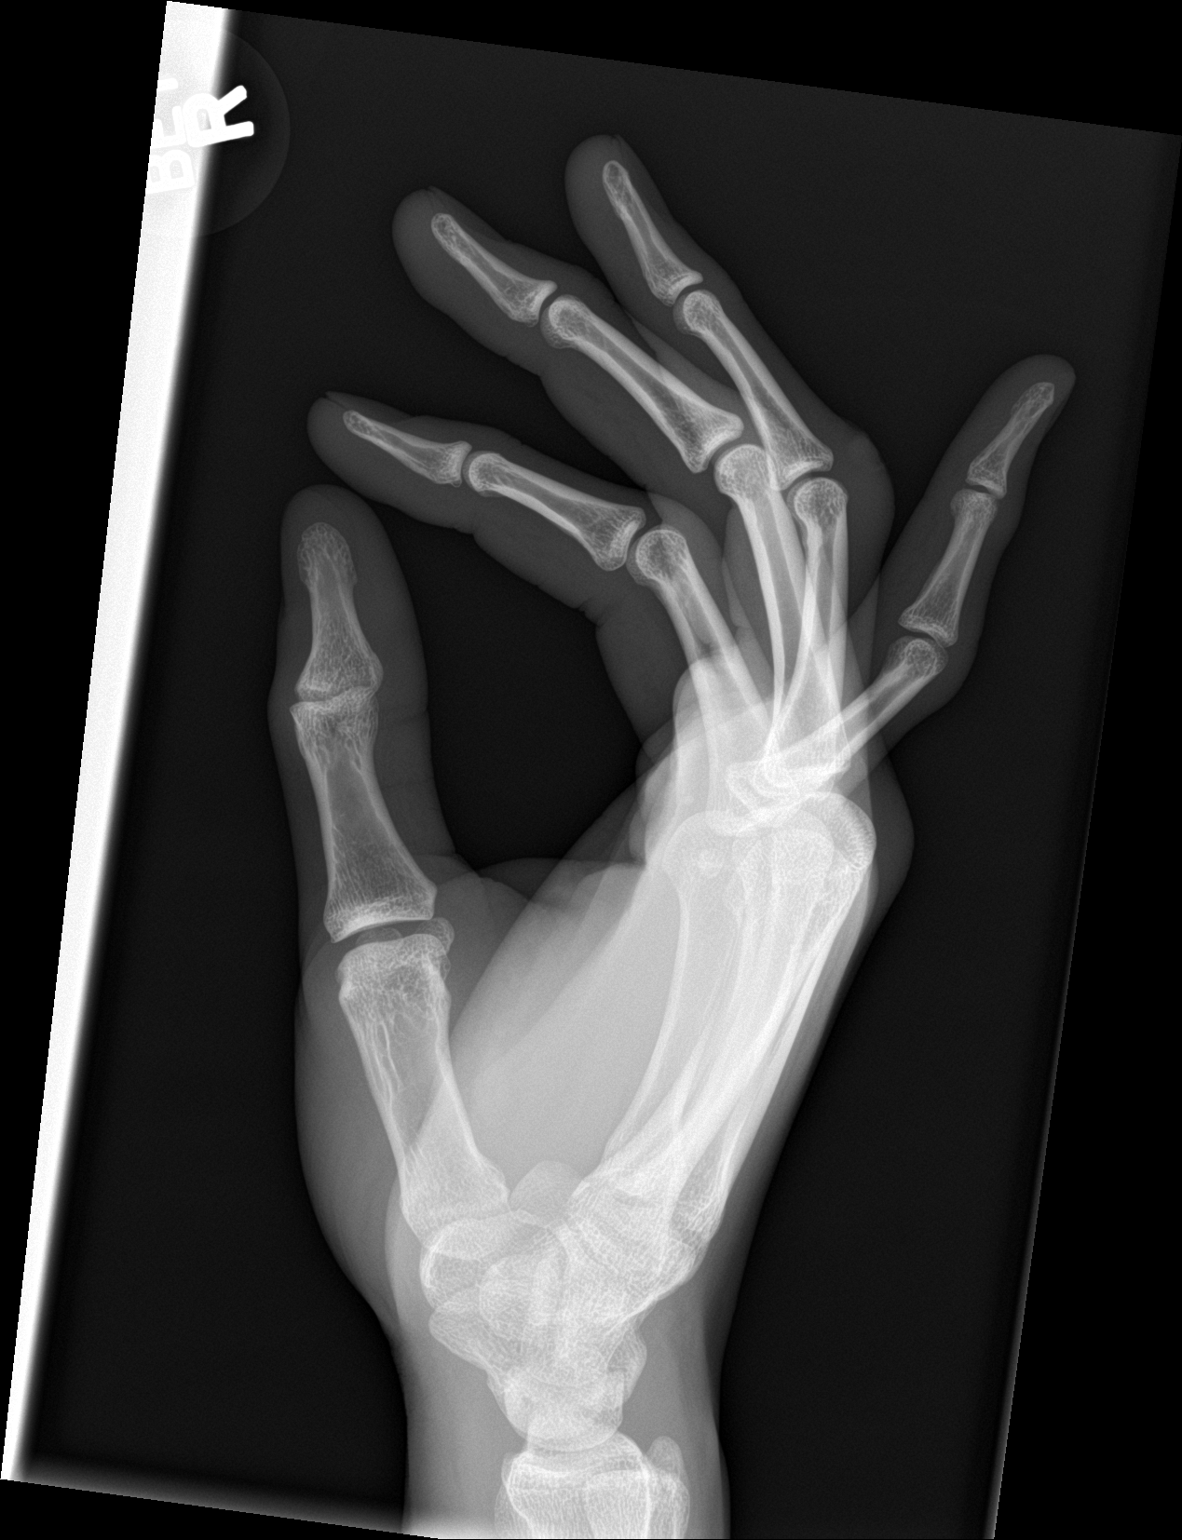

[3 of 3 positions shown; findings below may reference images not displayed]

FINDINGS: No fracture or dislocation. The alignment and joint spaces are
maintained. Mild dorsal soft tissue edema, no radiopaque foreign
body.
IMPRESSION: Mild soft tissue edema.  No fracture or dislocation.

## 2017-05-31 ENCOUNTER — Emergency Department (HOSPITAL_COMMUNITY)
Admission: EM | Admit: 2017-05-31 | Discharge: 2017-05-31 | Disposition: A | Discharge status: Home / Self Care | Payer: Self-pay | Attending: Emergency Medicine | Admitting: Emergency Medicine

## 2017-05-31 ENCOUNTER — Emergency Department (HOSPITAL_COMMUNITY): Payer: Self-pay

## 2017-05-31 ENCOUNTER — Encounter (HOSPITAL_COMMUNITY): Payer: Self-pay | Admitting: *Deleted

## 2017-05-31 ENCOUNTER — Other Ambulatory Visit: Payer: Self-pay

## 2017-05-31 DIAGNOSIS — Y92810 Car as the place of occurrence of the external cause: Secondary | ICD-10-CM | POA: Insufficient documentation

## 2017-05-31 DIAGNOSIS — W458XXA Other foreign body or object entering through skin, initial encounter: Secondary | ICD-10-CM | POA: Insufficient documentation

## 2017-05-31 DIAGNOSIS — S61431A Puncture wound without foreign body of right hand, initial encounter: Secondary | ICD-10-CM

## 2017-05-31 DIAGNOSIS — Y999 Unspecified external cause status: Secondary | ICD-10-CM | POA: Insufficient documentation

## 2017-05-31 DIAGNOSIS — Y9389 Activity, other specified: Secondary | ICD-10-CM | POA: Insufficient documentation

## 2017-05-31 DIAGNOSIS — Z23 Encounter for immunization: Secondary | ICD-10-CM | POA: Insufficient documentation

## 2017-05-31 MED ORDER — TETANUS-DIPHTH-ACELL PERTUSSIS 5-2.5-18.5 LF-MCG/0.5 IM SUSP
0.5000 mL | Freq: Once | INTRAMUSCULAR | Status: AC
  Administered 2017-05-31: 0.5 mL via INTRAMUSCULAR
  Filled 2017-05-31: qty 0.5

## 2017-05-31 MED ORDER — DOUBLE ANTIBIOTIC 500-10000 UNIT/GM EX OINT
TOPICAL_OINTMENT | Freq: Once | CUTANEOUS | Status: AC
  Administered 2017-05-31: 03:00:00 via TOPICAL
  Filled 2017-05-31: qty 1

## 2017-05-31 MED ORDER — BACITRACIN ZINC 500 UNIT/GM EX OINT: 1.0000 "application " | TOPICAL_OINTMENT | Freq: Two times a day (BID) | CUTANEOUS | 0 refills | Status: DC
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
## 2017-05-31 NOTE — Discharge Instructions (Addendum)
You were seen today for a puncture wound to the right hand.  Your x-rays are negative.  Keep clean and covered with antibiotic ointment.  This will likely heal without any ill effects.
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
## 2017-09-01 ENCOUNTER — Emergency Department (HOSPITAL_COMMUNITY)
Admission: EM | Admit: 2017-09-01 | Discharge: 2017-09-01 | Disposition: A | Discharge status: Home / Self Care | Payer: Self-pay | Attending: Emergency Medicine | Admitting: Emergency Medicine

## 2017-09-01 ENCOUNTER — Other Ambulatory Visit: Payer: Self-pay

## 2017-09-01 ENCOUNTER — Encounter (HOSPITAL_COMMUNITY): Payer: Self-pay | Admitting: Emergency Medicine

## 2017-09-01 DIAGNOSIS — Z5321 Procedure and treatment not carried out due to patient leaving prior to being seen by health care provider: Secondary | ICD-10-CM | POA: Insufficient documentation

## 2017-09-01 DIAGNOSIS — R112 Nausea with vomiting, unspecified: Secondary | ICD-10-CM | POA: Insufficient documentation

## 2017-09-01 DIAGNOSIS — R197 Diarrhea, unspecified: Secondary | ICD-10-CM | POA: Insufficient documentation

## 2017-09-01 LAB — URINALYSIS, ROUTINE W REFLEX MICROSCOPIC
BILIRUBIN URINE: NEGATIVE
GLUCOSE, UA: NEGATIVE mg/dL
HGB URINE DIPSTICK: NEGATIVE
Ketones, ur: NEGATIVE mg/dL
Leukocytes, UA: NEGATIVE
Nitrite: NEGATIVE
Protein, ur: NEGATIVE mg/dL
Specific Gravity, Urine: 1.018 (ref 1.005–1.030)
pH: 8 (ref 5.0–8.0)

## 2017-09-01 LAB — CBC
HCT: 43.8 % (ref 39.0–52.0)
Hemoglobin: 13.5 g/dL (ref 13.0–17.0)
MCH: 23.9 pg — AB (ref 26.0–34.0)
MCHC: 30.8 g/dL (ref 30.0–36.0)
MCV: 77.7 fL — AB (ref 78.0–100.0)
PLATELETS: 171 10*3/uL (ref 150–400)
RBC: 5.64 MIL/uL (ref 4.22–5.81)
RDW: 14 % (ref 11.5–15.5)
WBC: 6.4 10*3/uL (ref 4.0–10.5)

## 2017-09-01 LAB — COMPREHENSIVE METABOLIC PANEL
ALT: 21 U/L (ref 0–44)
ANION GAP: 7 (ref 5–15)
AST: 27 U/L (ref 15–41)
Albumin: 4.5 g/dL (ref 3.5–5.0)
Alkaline Phosphatase: 48 U/L (ref 38–126)
BUN: 16 mg/dL (ref 6–20)
CO2: 30 mmol/L (ref 22–32)
Calcium: 9.4 mg/dL (ref 8.9–10.3)
Chloride: 103 mmol/L (ref 98–111)
Creatinine, Ser: 1.01 mg/dL (ref 0.61–1.24)
GFR calc non Af Amer: >60 mL/min (ref >60)
GLUCOSE: 107 mg/dL — AB (ref 70–99)
Potassium: 4.2 mmol/L (ref 3.5–5.1)
SODIUM: 140 mmol/L (ref 135–145)
Total Bilirubin: 0.9 mg/dL (ref 0.3–1.2)
Total Protein: 7.5 g/dL (ref 6.5–8.1)

## 2017-09-01 LAB — LIPASE, BLOOD: LIPASE: 51 U/L (ref 11–51)

## 2017-09-01 NOTE — ED Notes (Signed)
Patient came out of room with spouse and stated they were leaving. States they do not want to stay any longer. Spouse states "I have the same symptoms and I know it's probably a virus. We have been waiting so long and there is a bug in his bed." Advised patient and family member that I would advise environmental about the bug and gave patient note stating he was at ER today. I did not print a work note keeping him from work due to not being seen by physician. Patient verbalized understanding. Signed out AMA.

## 2017-09-01 NOTE — ED Triage Notes (Signed)
Pt reports V/ started today, denies abd pain or D/. SO has same sx. Is able to keep PO fluids down.

## 2018-10-29 ENCOUNTER — Other Ambulatory Visit: Payer: Self-pay

## 2018-10-29 DIAGNOSIS — Z20822 Contact with and (suspected) exposure to covid-19: Secondary | ICD-10-CM

## 2018-10-30 LAB — NOVEL CORONAVIRUS, NAA: SARS-CoV-2, NAA: DETECTED — AB

## 2018-11-08 ENCOUNTER — Other Ambulatory Visit: Payer: Self-pay | Admitting: *Deleted

## 2018-11-08 DIAGNOSIS — Z20822 Contact with and (suspected) exposure to covid-19: Secondary | ICD-10-CM

## 2018-11-10 LAB — NOVEL CORONAVIRUS, NAA: SARS-CoV-2, NAA: NOT DETECTED

## 2018-11-21 ENCOUNTER — Emergency Department (HOSPITAL_COMMUNITY)
Admission: EM | Admit: 2018-11-21 | Discharge: 2018-11-21 | Disposition: A | Discharge status: Home / Self Care | Payer: Worker's Compensation | Attending: Emergency Medicine | Admitting: Emergency Medicine

## 2018-11-21 ENCOUNTER — Emergency Department (HOSPITAL_COMMUNITY): Payer: Worker's Compensation

## 2018-11-21 ENCOUNTER — Encounter (HOSPITAL_COMMUNITY): Payer: Self-pay | Admitting: Emergency Medicine

## 2018-11-21 ENCOUNTER — Other Ambulatory Visit: Payer: Self-pay

## 2018-11-21 DIAGNOSIS — F17210 Nicotine dependence, cigarettes, uncomplicated: Secondary | ICD-10-CM | POA: Insufficient documentation

## 2018-11-21 DIAGNOSIS — Y939 Activity, unspecified: Secondary | ICD-10-CM | POA: Insufficient documentation

## 2018-11-21 DIAGNOSIS — Y990 Civilian activity done for income or pay: Secondary | ICD-10-CM | POA: Diagnosis not present

## 2018-11-21 DIAGNOSIS — W228XXA Striking against or struck by other objects, initial encounter: Secondary | ICD-10-CM | POA: Diagnosis not present

## 2018-11-21 DIAGNOSIS — S61211A Laceration without foreign body of left index finger without damage to nail, initial encounter: Secondary | ICD-10-CM | POA: Diagnosis not present

## 2018-11-21 DIAGNOSIS — Z23 Encounter for immunization: Secondary | ICD-10-CM | POA: Insufficient documentation

## 2018-11-21 DIAGNOSIS — Y929 Unspecified place or not applicable: Secondary | ICD-10-CM | POA: Diagnosis not present

## 2018-11-21 MED ORDER — LIDOCAINE HCL (PF) 2 % IJ SOLN
5.0000 mL | Freq: Once | INTRAMUSCULAR | Status: AC
  Administered 2018-11-21: 5 mL

## 2018-11-21 MED ORDER — TETANUS-DIPHTH-ACELL PERTUSSIS 5-2.5-18.5 LF-MCG/0.5 IM SUSP
0.5000 mL | Freq: Once | INTRAMUSCULAR | Status: AC
  Administered 2018-11-21: 10:00:00 0.5 mL via INTRAMUSCULAR
  Filled 2018-11-21: qty 0.5

## 2018-11-21 MED ORDER — POVIDONE-IODINE 10 % EX SOLN
CUTANEOUS | Status: DC | PRN
  Administered 2018-11-21: 1 via TOPICAL
  Filled 2018-11-21: qty 15

## 2018-11-21 MED ORDER — LIDOCAINE HCL (PF) 1 % IJ SOLN
INTRAMUSCULAR | Status: AC
  Administered 2018-11-21: 10:00:00
  Filled 2018-11-21: qty 2

## 2018-11-21 MED ORDER — TRAMADOL HCL 50 MG PO TABS: 50.0000 mg | ORAL_TABLET | Freq: Four times a day (QID) | ORAL | 0 refills | Status: DC | PRN

## 2018-11-21 MED ORDER — IBUPROFEN 600 MG PO TABS: 600.0000 mg | ORAL_TABLET | Freq: Four times a day (QID) | ORAL | 0 refills | Status: AC | PRN

## 2018-11-21 NOTE — ED Triage Notes (Signed)
Pt cut his left index finger on a pole today. Bleeding controlled

## 2018-11-21 NOTE — ED Provider Notes (Signed)
[facility] Provider [facility]   CSN: [id number] Arrival date & time: 11/21/18  0908     History   Chief Complaint Chief Complaint  Patient presents with  . Laceration    HPI Danny Walls is a 23 y.o. male.     The history is provided by the patient.  Laceration Location:  Hand Hand laceration location:  L fingers Length:  1 cm Depth:  Cutaneous Bleeding: controlled   Time since incident:  1 hour Laceration mechanism:  Blunt object (pt's finger was hit by a pipe edge while working) Pain details:    Quality:  Throbbing   Severity:  Severe   Timing:  Constant   Progression:  Unchanged Foreign body present:  No foreign bodies Relieved by:  None tried Worsened by:  Movement Ineffective treatments:  None tried Tetanus status:  Unknown Associated symptoms: numbness   Associated symptoms: no fever and no focal weakness   Associated symptoms comment:  Reports numbness at tip of finger, no numbness bilateral fingertip edges.   History reviewed. No pertinent past medical history.  There are no active problems to display for this patient.   History reviewed. No pertinent surgical history.      Home Medications    Prior to Admission medications   Medication Sig Start Date End Date Taking? Authorizing Provider  ibuprofen (ADVIL) 600 MG tablet Take 1 tablet (600 mg total) by mouth every 6 (six) hours as needed for moderate pain. 11/21/18   Burgess AmorIdol, Veola Cafaro, PA-C  traMADol (ULTRAM) 50 MG tablet Take 1 tablet (50 mg total) by mouth every 6 (six) hours as needed for severe pain. 11/21/18   Burgess AmorIdol, Florian Chauca, PA-C    Family History History reviewed. No pertinent family history.  Social History Social History   Tobacco Use  . Smoking status: Current Every Day Smoker    Packs/day: 0.10  . Smokeless tobacco: Never Used  Substance Use Topics  . Alcohol use: No  . Drug use: No     Allergies   Patient has no known allergies.   Review of Systems Review of  Systems  Constitutional: Negative for chills and fever.  Musculoskeletal: Positive for arthralgias.  Skin: Positive for wound.  Neurological: Positive for numbness. Negative for focal weakness and weakness.     Physical Exam Updated Vital Signs BP 121/88   Pulse (!) 42   Temp 97.8 F (36.6 C) (Oral)   Resp 16   SpO2 100%   Physical Exam Vitals signs and nursing note reviewed.  Constitutional:      Appearance: He is well-developed.  HENT:     Head: Normocephalic.  Cardiovascular:     Rate and Rhythm: Normal rate.  Pulmonary:     Effort: Pulmonary effort is normal.  Musculoskeletal:        General: Tenderness present.     Left hand: He exhibits tenderness and laceration. He exhibits normal capillary refill, no deformity and no swelling. Decreased sensation noted. He exhibits no finger abduction.     Comments: Decreased sensation to fine touch left distal fingertip, but no along the ulnar or radial distribution of entirety of the finger.    Skin:    Findings: Laceration present.     Comments: 2 superficial lacerations volar left index finger across the dip joint, hemostatic and approximated, 0.5 cm length. 0.5 cm dorsal finger also at the dip joint, subcutaneous. Hemostatic.  Neurological:     Mental Status: He is alert and oriented to person,  place, and time.     Sensory: No sensory deficit.      ED Treatments / Results  Labs (all labs ordered are listed, but only abnormal results are displayed) Labs Reviewed - No data to display  EKG None  Radiology Dg Finger Index Left  Result Date: 11/21/2018 CLINICAL DATA:  Left index finger laceration today. Initial encounter. EXAM: LEFT INDEX FINGER 2+V COMPARISON:  None. FINDINGS: There is no evidence of fracture or dislocation. There is no evidence of arthropathy or other focal bone abnormality. Soft tissues are unremarkable. IMPRESSION: Negative exam. Electronically Signed   By: Inge Rise M.D.   On: 11/21/2018 10:34     Procedures Procedures (including critical care time)  LACERATION REPAIR Performed by: Evalee Jefferson Authorized by: Evalee Jefferson Consent: Verbal consent obtained. Risks and benefits: risks, benefits and alternatives were discussed Consent given by: patient Patient identity confirmed: provided demographic data Prepped and Draped in normal sterile fashion Wound explored  Laceration Location: left distal index finger, volar and dorsal  Laceration Length: 1.5 cm total with 3 separate small wounds   No Foreign Bodies seen or palpated  Anesthesia: digital block  Local anesthetic: lidocaine 1% without epinephrine  Anesthetic total: 4 ml  Irrigation method: syringe Amount of cleaning: standard  Skin closure: ethilon 4-0  Number of sutures: 5  Technique: simple interupted  Patient tolerance: Patient tolerated the procedure well with no immediate complications.   Medications Ordered in ED Medications  povidone-iodine (BETADINE) 10 % external solution (1 application Topical Given by Other 11/21/18 0943)  lidocaine (XYLOCAINE) 2 % injection 5 mL (5 mLs Other Given by Other 11/21/18 0943)  Tdap (BOOSTRIX) injection 0.5 mL (0.5 mLs Intramuscular Given 11/21/18 1017)  lidocaine (PF) (XYLOCAINE) 1 % injection (  Given by Other 11/21/18 0944)     Initial Impression / Assessment and Plan / ED Course  I have reviewed the triage vital signs and the nursing notes.  Pertinent labs & imaging results that were available during my care of the patient were reviewed by me and considered in my medical decision making (see chart for details).        Imaging reviewed and discussed with pt.  Tetanus updated.  Wound care instructions given.  Pt advised to have sutures removed in 10 days,  Return here sooner for any signs of infection including redness, swelling, worse pain or drainage of pus.  Pt has sensation along the entirety of his finger, to distal edges, reports distal numbness finger tip,  expect temporary finding given the lateral nerves are intact.       Final Clinical Impressions(s) / ED Diagnoses   Final diagnoses:  Laceration of left index finger without foreign body without damage to nail, initial encounter    ED Discharge Orders         Ordered    ibuprofen (ADVIL) 600 MG tablet  Every 6 hours PRN     11/21/18 1039    traMADol (ULTRAM) 50 MG tablet  Every 6 hours PRN     11/21/18 1039           Evalee Jefferson, PA-C 11/21/18 1045    Long, Wonda Olds, MD 11/21/18 1415

## 2018-11-21 NOTE — Discharge Instructions (Addendum)
Have your sutures removed in 10 days.  Keep your wound clean and dry,  Until a good scab forms - you may then wash gently twice daily with mild soap and water, but dry completely after.  Get rechecked for any sign of infection (redness,  Swelling,  Increased pain or drainage of purulent fluid).    Use the motrin if needed for moderate pain relief.  You may add the tramadol if needed for additional pain relief but this medicine will make you drowsy- do not use at work or within 4 hours of driving.

## 2019-11-14 ENCOUNTER — Other Ambulatory Visit: Payer: Managed Care, Other (non HMO)

## 2019-11-14 ENCOUNTER — Other Ambulatory Visit: Payer: Self-pay

## 2019-11-14 DIAGNOSIS — Z20822 Contact with and (suspected) exposure to covid-19: Secondary | ICD-10-CM

## 2019-11-16 LAB — SARS-COV-2, NAA 2 DAY TAT

## 2019-11-16 LAB — NOVEL CORONAVIRUS, NAA: SARS-CoV-2, NAA: NOT DETECTED

## 2021-04-14 ENCOUNTER — Ambulatory Visit
Admission: EM | Admit: 2021-04-14 | Discharge: 2021-04-14 | Disposition: A | Discharge status: Home / Self Care | Payer: BC Managed Care – PPO | Attending: Family Medicine | Admitting: Family Medicine

## 2021-04-14 ENCOUNTER — Encounter: Payer: Self-pay | Admitting: Emergency Medicine

## 2021-04-14 ENCOUNTER — Other Ambulatory Visit: Payer: Self-pay

## 2021-04-14 DIAGNOSIS — J069 Acute upper respiratory infection, unspecified: Secondary | ICD-10-CM | POA: Diagnosis not present

## 2021-04-14 DIAGNOSIS — Z9189 Other specified personal risk factors, not elsewhere classified: Secondary | ICD-10-CM

## 2021-04-14 MED ORDER — PROMETHAZINE-DM 6.25-15 MG/5ML PO SYRP: 5.0000 mL | ORAL_SOLUTION | Freq: Four times a day (QID) | ORAL | 0 refills | Status: DC | PRN

## 2021-04-14 NOTE — ED Provider Notes (Signed)
°  [facility] CARE CENTER   [id number] 04/14/21 Arrival Time: 1807  ASSESSMENT & PLAN:  1. At increased risk of exposure to COVID-19 virus   2. Viral URI with cough    Discussed typical duration of viral illnesses. Viral testing sent. OTC symptom care as needed. Work note provided.  New Prescriptions   PROMETHAZINE-DEXTROMETHORPHAN (PROMETHAZINE-DM) 6.25-15 MG/5ML SYRUP    Take 5 mLs by mouth 4 (four) times daily as needed for cough.     Follow-up Information     [facility].   Specialty: Urgent Care Why: If worsening or failing to improve as anticipated. Contact information: [address], [address] [place] [address] [phone]                Reviewed expectations re: course of current medical issues. Questions answered. Outlined signs and symptoms indicating need for more acute intervention. Understanding verbalized. After Visit Summary given.   SUBJECTIVE: History from: Patient. Danny Walls is a 26 y.o. male. Reports: cough, ST, HA, body aches; abrupt onset; x 2 days. Denies: fever and difficulty breathing. Normal PO intake without n/v/d.  OBJECTIVE:  Vitals:   04/14/21 1850  BP: 122/79  Pulse: (!) 59  Resp: 18  Temp: 98 F (36.7 C)  TempSrc: Oral  SpO2: 97%    General appearance: alert; no distress Eyes: PERRLA; EOMI; conjunctiva normal HENT: [place]; AT; with nasal congestion Neck: supple  Lungs: speaks full sentences without difficulty; unlabored; dry cough Extremities: no edema Skin: warm and dry Neurologic: normal gait Psychological: alert and cooperative; normal mood and affect  Labs:  Labs Reviewed  COVID-19, FLU A+B NAA     No Known Allergies  History reviewed. No pertinent past medical history. Social History   Socioeconomic History   Marital status: Married    Spouse name: Not on file   Number of children: Not on file   Years of education: Not on file   Highest education level:  Not on file  Occupational History   Not on file  Tobacco Use   Smoking status: Every Day    Packs/day: 0.10    Types: Cigarettes   Smokeless tobacco: Never  Substance and Sexual Activity   Alcohol use: No   Drug use: No   Sexual activity: Never  Other Topics Concern   Not on file  Social History Narrative   Not on file   Social Determinants of Health   Financial Resource Strain: Not on file  Food Insecurity: Not on file  Transportation Needs: Not on file  Physical Activity: Not on file  Stress: Not on file  Social Connections: Not on file  Intimate Partner Violence: Not on file   History reviewed. No pertinent family history. History reviewed. No pertinent surgical history.   [name], MD 04/14/21 [phone]

## 2021-04-14 NOTE — ED Triage Notes (Signed)
Coughing, sore throat, headache, body aches x 2 days.

## 2021-04-15 LAB — COVID-19, FLU A+B NAA
Influenza A, NAA: NOT DETECTED
Influenza B, NAA: NOT DETECTED
SARS-CoV-2, NAA: NOT DETECTED

## 2023-04-30 ENCOUNTER — Encounter (HOSPITAL_COMMUNITY): Payer: Self-pay

## 2023-04-30 ENCOUNTER — Other Ambulatory Visit: Payer: Self-pay

## 2023-04-30 ENCOUNTER — Emergency Department (HOSPITAL_COMMUNITY)
Admission: EM | Admit: 2023-04-30 | Discharge: 2023-04-30 | Disposition: A | Discharge status: Home / Self Care | Payer: No Typology Code available for payment source | Attending: Emergency Medicine | Admitting: Emergency Medicine

## 2023-04-30 DIAGNOSIS — J101 Influenza due to other identified influenza virus with other respiratory manifestations: Secondary | ICD-10-CM | POA: Diagnosis not present

## 2023-04-30 DIAGNOSIS — E860 Dehydration: Secondary | ICD-10-CM | POA: Insufficient documentation

## 2023-04-30 DIAGNOSIS — R112 Nausea with vomiting, unspecified: Secondary | ICD-10-CM

## 2023-04-30 LAB — CBC
HCT: 36.3 % — ABNORMAL LOW (ref 39.0–52.0)
Hemoglobin: 11.2 g/dL — ABNORMAL LOW (ref 13.0–17.0)
MCH: 24 pg — ABNORMAL LOW (ref 26.0–34.0)
MCHC: 30.9 g/dL (ref 30.0–36.0)
MCV: 77.9 fL — ABNORMAL LOW (ref 80.0–100.0)
Platelets: 141 10*3/uL — ABNORMAL LOW (ref 150–400)
RBC: 4.66 MIL/uL (ref 4.22–5.81)
RDW: 14 % (ref 11.5–15.5)
WBC: 4.2 10*3/uL (ref 4.0–10.5)
nRBC: 0 % (ref 0.0–0.2)

## 2023-04-30 LAB — COMPREHENSIVE METABOLIC PANEL
ALT: 16 U/L (ref 0–44)
AST: 25 U/L (ref 15–41)
Albumin: 3.3 g/dL — ABNORMAL LOW (ref 3.5–5.0)
Alkaline Phosphatase: 39 U/L (ref 38–126)
Anion gap: 12 (ref 5–15)
BUN: 12 mg/dL (ref 6–20)
CO2: 24 mmol/L (ref 22–32)
Calcium: 8.5 mg/dL — ABNORMAL LOW (ref 8.9–10.3)
Chloride: 97 mmol/L — ABNORMAL LOW (ref 98–111)
Creatinine, Ser: 0.88 mg/dL (ref 0.61–1.24)
GFR, Estimated: >60 mL/min (ref >60)
Glucose, Bld: 99 mg/dL (ref 70–99)
Potassium: 3.8 mmol/L (ref 3.5–5.1)
Sodium: 133 mmol/L — ABNORMAL LOW (ref 135–145)
Total Bilirubin: 0.4 mg/dL (ref 0.0–1.2)
Total Protein: 6.1 g/dL — ABNORMAL LOW (ref 6.5–8.1)

## 2023-04-30 LAB — LIPASE, BLOOD: Lipase: 24 U/L (ref 11–51)

## 2023-04-30 LAB — RESP PANEL BY RT-PCR (RSV, FLU A&B, COVID)  RVPGX2
Influenza A by PCR: POSITIVE — AB
Influenza B by PCR: NEGATIVE
Resp Syncytial Virus by PCR: NEGATIVE
SARS Coronavirus 2 by RT PCR: NEGATIVE

## 2023-04-30 MED ORDER — ACETAMINOPHEN 325 MG PO TABS
650.0000 mg | ORAL_TABLET | Freq: Once | ORAL | Status: AC
  Administered 2023-04-30: 650 mg via ORAL
  Filled 2023-04-30: qty 2

## 2023-04-30 MED ORDER — NAPROXEN 500 MG PO TABS: 500.0000 mg | ORAL_TABLET | Freq: Two times a day (BID) | ORAL | 0 refills | Status: AC

## 2023-04-30 MED ORDER — ONDANSETRON 4 MG PO TBDP: 4.0000 mg | ORAL_TABLET | Freq: Three times a day (TID) | ORAL | 0 refills | Status: AC | PRN

## 2023-04-30 MED ORDER — LACTATED RINGERS IV BOLUS
1000.0000 mL | Freq: Once | INTRAVENOUS | Status: AC
  Administered 2023-04-30: 1000 mL via INTRAVENOUS

## 2023-04-30 MED ORDER — SODIUM CHLORIDE 0.9 % IV BOLUS: 1000.0000 mL | Freq: Once | INTRAVENOUS | Status: DC

## 2023-04-30 MED ORDER — PROMETHAZINE-DM 6.25-15 MG/5ML PO SYRP: 5.0000 mL | ORAL_SOLUTION | Freq: Four times a day (QID) | ORAL | 0 refills | Status: AC | PRN

## 2023-04-30 MED ORDER — ONDANSETRON HCL 4 MG/2ML IJ SOLN
4.0000 mg | Freq: Once | INTRAMUSCULAR | Status: AC
  Administered 2023-04-30: 4 mg via INTRAVENOUS
  Filled 2023-04-30: qty 2

## 2023-04-30 MED ORDER — KETOROLAC TROMETHAMINE 15 MG/ML IJ SOLN
15.0000 mg | Freq: Once | INTRAMUSCULAR | Status: AC
  Administered 2023-04-30: 15 mg via INTRAVENOUS
  Filled 2023-04-30: qty 1

## 2023-04-30 NOTE — ED Triage Notes (Signed)
 N/V/D since Thursday, unable to hold anything down, children sick in the home as well.

## 2023-04-30 NOTE — ED Provider Notes (Signed)
 [place] EMERGENCY DEPARTMENT AT [facility] Provider Note   CSN: [id number] Arrival date & time: 04/30/23  1309     History  Chief Complaint  Patient presents with   Emesis    Danny Walls is a 28 y.o. male.  Patient is a 28 year old male who presents emergency department the chief complaint of nausea, vomiting, generalized bodyaches, chills and cough which has been ongoing for approximate the past 4 days.  Patient notes that he has been exposed to other members in his home who have been sick with similar symptoms.  Patient notes that he has attempted to take home medications for his symptoms though he is continue to vomit with them.  Patient denies any abdominal pain.  He denies any associated chest pain or shortness of breath.  He has had no dizziness, lightheadedness or syncope.   Emesis Associated symptoms: cough and fever        Home Medications Prior to Admission medications   Medication Sig Start Date End Date Taking? Authorizing Provider  ibuprofen (ADVIL) 600 MG tablet Take 1 tablet (600 mg total) by mouth every 6 (six) hours as needed for moderate pain. 11/21/18   Burgess Amor, PA-C  promethazine-dextromethorphan (PROMETHAZINE-DM) 6.25-15 MG/5ML syrup Take 5 mLs by mouth 4 (four) times daily as needed for cough. 04/14/21   [name], MD      Allergies    Patient has no known allergies.    Review of Systems   Review of Systems  Constitutional:  Positive for fever.  Respiratory:  Positive for cough.   Gastrointestinal:  Positive for nausea and vomiting.    Physical Exam Updated Vital Signs BP 111/72   Pulse 68   Temp (!) 101 F (38.3 C) (Oral)   Resp 18   SpO2 96%  Physical Exam Vitals reviewed.  Constitutional:      Appearance: Normal appearance.  HENT:     Head: Normocephalic and atraumatic.     Nose: Nose normal. No congestion or rhinorrhea.     Mouth/Throat:     Mouth: Mucous membranes are moist.     Pharynx: No oropharyngeal exudate  or posterior oropharyngeal erythema.  Eyes:     Extraocular Movements: Extraocular movements intact.     Conjunctiva/sclera: Conjunctivae normal.     Pupils: Pupils are equal, round, and reactive to light.  Cardiovascular:     Rate and Rhythm: Normal rate and regular rhythm.     Pulses: Normal pulses.     Heart sounds: Normal heart sounds. No murmur heard. Pulmonary:     Effort: Pulmonary effort is normal. No respiratory distress.     Breath sounds: Normal breath sounds. No stridor. No wheezing, rhonchi or rales.  Abdominal:     General: Abdomen is flat. Bowel sounds are normal. There is no distension.     Palpations: Abdomen is soft.     Tenderness: There is no abdominal tenderness. There is no guarding.  Musculoskeletal:        General: No swelling. Normal range of motion.     Cervical back: Normal range of motion and neck supple.  Skin:    General: Skin is warm and dry.  Neurological:     General: No focal deficit present.     Mental Status: He is alert and oriented to person, place, and time. Mental status is at baseline.  Psychiatric:        Mood and Affect: Mood normal.        Behavior:  Behavior normal.        Thought Content: Thought content normal.        Judgment: Judgment normal.     ED Results / Procedures / Treatments   Labs (all labs ordered are listed, but only abnormal results are displayed) Labs Reviewed  RESP PANEL BY RT-PCR (RSV, FLU A&B, COVID)  RVPGX2 - Abnormal; Notable for the following components:      Result Value   Influenza A by PCR POSITIVE (*)    All other components within normal limits  COMPREHENSIVE METABOLIC PANEL - Abnormal; Notable for the following components:   Sodium 133 (*)    Chloride 97 (*)    Calcium 8.5 (*)    Total Protein 6.1 (*)    Albumin 3.3 (*)    All other components within normal limits  CBC - Abnormal; Notable for the following components:   Hemoglobin 11.2 (*)    HCT 36.3 (*)    MCV 77.9 (*)    MCH 24.0 (*)     Platelets 141 (*)    All other components within normal limits  LIPASE, BLOOD    EKG None  Radiology No results found.  Procedures Procedures    Medications Ordered in ED Medications  ketorolac (TORADOL) 15 MG/ML injection 15 mg (15 mg Intravenous Given 04/30/23 1345)  ondansetron (ZOFRAN) injection 4 mg (4 mg Intravenous Given 04/30/23 1346)  lactated ringers bolus 1,000 mL (0 mLs Intravenous Stopped 04/30/23 1524)  acetaminophen (TYLENOL) tablet 650 mg (650 mg Oral Given 04/30/23 1422)    ED Course/ Medical Decision Making/ A&P                                 Medical Decision Making Amount and/or Complexity of Data Reviewed Labs: ordered.  Risk OTC drugs. Prescription drug management.   This patient presents to the ED for concern of nausea, vomiting, cough, congestion, fever differential diagnosis includes acute viral syndrome, appendicitis, cholecystitis, bowel torsion, diverticulitis, pneumonia    Additional history obtained:  Additional history obtained from none External records from outside source obtained and reviewed including none   Lab Tests:  I Ordered, and personally interpreted labs.  The pertinent results include: Mild anemia, hyponatremia, influenza A positive   Medicines ordered and prescription drug management:  I ordered medication including Tylenol, IV fluids, Zofran for fever, body aches, nausea vomiting Reevaluation of the patient after these medicines showed that the patient improved I have reviewed the patients home medicines and have made adjustments as needed   Problem List / ED Course:  Patient is doing much better at this time and is stable for discharge home.  Discussed with patient he is positive for influenza A.  He is outside of the window for Tamiflu at this time and will continue symptomatic treatment on outpatient basis.  Vital signs are stable at this time with no indication for sepsis.  Blood work was overall unremarkable in  the emergency department.  Will continue symptomatic treatment on an outpatient basis and recommend close follow-up with primary care doctor.  Strict return precautions were discussed for any new or worsening symptoms.  Patient voiced understanding had no additional questions.   Social Determinants of Health:  None           Final Clinical Impression(s) / ED Diagnoses Final diagnoses:  None    Rx / DC Orders ED Discharge Orders     None  Lelon Perla, PA-C 04/30/23 1548    Benjiman Core, MD 04/30/23 [phone]

## 2023-04-30 NOTE — Discharge Instructions (Signed)
 Please follow-up closely with your primary care doctor on an outpatient basis.  Return to emergency department immediately for any new or worsening symptoms.

## 2024-01-08 ENCOUNTER — Encounter (HOSPITAL_COMMUNITY): Payer: Self-pay | Admitting: Emergency Medicine

## 2024-01-08 ENCOUNTER — Other Ambulatory Visit: Payer: Self-pay

## 2024-01-08 ENCOUNTER — Emergency Department (HOSPITAL_COMMUNITY)
Admission: EM | Admit: 2024-01-08 | Discharge: 2024-01-08 | Disposition: A | Discharge status: Home / Self Care | Attending: Emergency Medicine | Admitting: Emergency Medicine

## 2024-01-08 DIAGNOSIS — R519 Headache, unspecified: Secondary | ICD-10-CM | POA: Insufficient documentation

## 2024-01-08 LAB — COMPREHENSIVE METABOLIC PANEL WITH GFR
ALT: 17 U/L (ref 0–44)
AST: 23 U/L (ref 15–41)
Albumin: 4.6 g/dL (ref 3.5–5.0)
Alkaline Phosphatase: 48 U/L (ref 38–126)
Anion gap: 9 (ref 5–15)
BUN: 11 mg/dL (ref 6–20)
CO2: 27 mmol/L (ref 22–32)
Calcium: 9.3 mg/dL (ref 8.9–10.3)
Chloride: 101 mmol/L (ref 98–111)
Creatinine, Ser: 0.8 mg/dL (ref 0.61–1.24)
GFR, Estimated: >60 mL/min (ref >60)
Glucose, Bld: 98 mg/dL (ref 70–99)
Potassium: 3.9 mmol/L (ref 3.5–5.1)
Sodium: 137 mmol/L (ref 135–145)
Total Bilirubin: 0.6 mg/dL (ref 0.0–1.2)
Total Protein: 6.9 g/dL (ref 6.5–8.1)

## 2024-01-08 LAB — CBC WITH DIFFERENTIAL/PLATELET
Abs Immature Granulocytes: 0.02 K/uL (ref 0.00–0.07)
Basophils Absolute: 0 K/uL (ref 0.0–0.1)
Basophils Relative: 0 %
Eosinophils Absolute: 0 K/uL (ref 0.0–0.5)
Eosinophils Relative: 0 %
HCT: 39.5 % (ref 39.0–52.0)
Hemoglobin: 12.3 g/dL — ABNORMAL LOW (ref 13.0–17.0)
Immature Granulocytes: 0 %
Lymphocytes Relative: 17 %
Lymphs Abs: 1.4 K/uL (ref 0.7–4.0)
MCH: 24.1 pg — ABNORMAL LOW (ref 26.0–34.0)
MCHC: 31.1 g/dL (ref 30.0–36.0)
MCV: 77.5 fL — ABNORMAL LOW (ref 80.0–100.0)
Monocytes Absolute: 0.2 K/uL (ref 0.1–1.0)
Monocytes Relative: 3 %
Neutro Abs: 6.3 K/uL (ref 1.7–7.7)
Neutrophils Relative %: 80 %
Platelets: 201 K/uL (ref 150–400)
RBC: 5.1 MIL/uL (ref 4.22–5.81)
RDW: 14.3 % (ref 11.5–15.5)
WBC: 7.9 K/uL (ref 4.0–10.5)
nRBC: 0 % (ref 0.0–0.2)

## 2024-01-08 MED ORDER — KETOROLAC TROMETHAMINE 30 MG/ML IJ SOLN
30.0000 mg | Freq: Once | INTRAMUSCULAR | Status: AC
  Administered 2024-01-08: 30 mg via INTRAVENOUS
  Filled 2024-01-08: qty 1

## 2024-01-08 MED ORDER — LACTATED RINGERS IV BOLUS
1000.0000 mL | Freq: Once | INTRAVENOUS | Status: AC
  Administered 2024-01-08: 1000 mL via INTRAVENOUS

## 2024-01-08 MED ORDER — PROCHLORPERAZINE EDISYLATE 10 MG/2ML IJ SOLN
10.0000 mg | Freq: Once | INTRAMUSCULAR | Status: AC
  Administered 2024-01-08: 10 mg via INTRAVENOUS
  Filled 2024-01-08: qty 2

## 2024-01-08 NOTE — ED Triage Notes (Signed)
 Pt here with c/o headache since 3pm yesterday. States that he tried to take an Ibuprofen  but it came right back up. Reports 4 episodes of emesis.

## 2024-01-08 NOTE — ED Provider Notes (Signed)
 [place] [facility] AT [facility]  Provider Note  CSN: [id number] Arrival date & time: 01/08/24 0044  History Chief Complaint  Patient presents with   Headache   Emesis    Danny Walls is a 28 y.o. male here for evaluation of L>R frontal headache, gradual in onset around 3pm on 11/2 associated with photophobia and nausea with several episodes of vomiting. He has had similar headaches 3-4 times per year but does not have a formal diagnosis of migraine and does not take any medications on a regular basis.    Home Medications Prior to Admission medications   Medication Sig Start Date End Date Taking? Authorizing Provider  ibuprofen  (ADVIL ) 600 MG tablet Take 1 tablet (600 mg total) by mouth every 6 (six) hours as needed for moderate pain. 11/21/18   Idol, Julie, PA-C  naproxen  (NAPROSYN ) 500 MG tablet Take 1 tablet (500 mg total) by mouth 2 (two) times daily. 04/30/23   Daralene Lonni BIRCH, PA-C  ondansetron  (ZOFRAN -ODT) 4 MG disintegrating tablet Take 1 tablet (4 mg total) by mouth every 8 (eight) hours as needed for nausea or vomiting. 04/30/23   Daralene Lonni BIRCH, PA-C  promethazine -dextromethorphan (PROMETHAZINE -DM) 6.25-15 MG/5ML syrup Take 5 mLs by mouth 4 (four) times daily as needed. 04/30/23   Daralene Lonni BIRCH, PA-C     Allergies    Patient has no known allergies.   Review of Systems   Review of Systems Please see HPI for pertinent positives and negatives  Physical Exam BP 117/78 (BP Location: Left Arm)   Pulse (!) 51   Temp 97.8 F (36.6 C) (Oral)   Resp 16   Ht 5' 9 (1.753 m)   Wt 65.8 kg   SpO2 98%   BMI 21.41 kg/m   Physical Exam Vitals and nursing note reviewed.  Constitutional:      Appearance: Normal appearance.  HENT:     Head: Normocephalic and atraumatic.     Nose: Nose normal.     Mouth/Throat:     Mouth: Mucous membranes are moist.  Eyes:     Extraocular Movements: Extraocular movements intact.      Conjunctiva/sclera: Conjunctivae normal.  Cardiovascular:     Rate and Rhythm: Normal rate.  Pulmonary:     Effort: Pulmonary effort is normal.     Breath sounds: Normal breath sounds.  Abdominal:     General: Abdomen is flat.     Palpations: Abdomen is soft.     Tenderness: There is no abdominal tenderness.  Musculoskeletal:        General: No swelling. Normal range of motion.     Cervical back: Neck supple.  Skin:    General: Skin is warm and dry.  Neurological:     General: No focal deficit present.     Mental Status: He is alert and oriented to person, place, and time.     Cranial Nerves: No cranial nerve deficit.     Sensory: No sensory deficit.     Motor: No weakness.     Gait: Gait normal.  Psychiatric:        Mood and Affect: Mood normal.     ED Results / Procedures / Treatments   EKG None  Procedures Procedures  Medications Ordered in the ED Medications  ketorolac  (TORADOL ) 30 MG/ML injection 30 mg (30 mg Intravenous Given 01/08/24 0119)  prochlorperazine (COMPAZINE) injection 10 mg (10 mg Intravenous Given 01/08/24 0118)  lactated ringers  bolus 1,000 mL (0 mLs  Intravenous Stopped 01/08/24 0224)    Initial Impression and Plan  Patient here for gradual onset headache, similar to previous, now with N/V. Will give headache cocktail, check labs and give IVF for comfort.   ED Course   Clinical Course as of 01/08/24 0225  Mon Jan 08, 2024  0137 CBC is unremarkable.  [CS]  0148 CMP is normal.  [CS]  0224 Patient feeling much better, symptoms resolved and ready to go home. Recommend PCP and/or Neurology follow up for long term management of headaches.  [CS]    Clinical Course User Index [CS] Roselyn Carlin NOVAK, MD     MDM Rules/Calculators/A&P Medical Decision Making Problems Addressed: Acute nonintractable headache, unspecified headache type: acute illness or injury  Amount and/or Complexity of Data Reviewed Labs: ordered. Decision-making details  documented in ED Course.  Risk Prescription drug management.     Final Clinical Impression(s) / ED Diagnoses Final diagnoses:  Acute nonintractable headache, unspecified headache type    Rx / DC Orders ED Discharge Orders          Ordered    Ambulatory referral to Neurology       Comments: An appointment is requested in approximately: 4 weeks   01/08/24 0225             Roselyn Carlin NOVAK, MD 01/08/24 0225

## 2024-01-08 NOTE — ED Notes (Signed)
 ED Provider at bedside.
